# Patient Record
Sex: Male | Born: 1955 | Race: Black or African American | Hispanic: No | Marital: Single | Smoking: Never smoker
Health system: Southern US, Community
[De-identification: ages and names within clinical notes are randomized; demographics above are authoritative.]

## PROBLEM LIST (undated history)

## (undated) DIAGNOSIS — I10 Essential (primary) hypertension: Secondary | ICD-10-CM

## (undated) HISTORY — DX: Essential (primary) hypertension: I10

---

## 1997-10-15 ENCOUNTER — Encounter: Admission: RE | Admit: 1997-10-15 | Discharge: 1997-10-15 | Payer: Self-pay | Admitting: *Deleted

## 2016-07-25 ENCOUNTER — Ambulatory Visit (INDEPENDENT_AMBULATORY_CARE_PROVIDER_SITE_OTHER): Payer: Self-pay

## 2016-07-25 ENCOUNTER — Ambulatory Visit (INDEPENDENT_AMBULATORY_CARE_PROVIDER_SITE_OTHER): Payer: Self-pay | Admitting: Emergency Medicine

## 2016-07-25 ENCOUNTER — Encounter: Payer: Self-pay | Admitting: Emergency Medicine

## 2016-07-25 VITALS — BP 161/110 | HR 104 | Temp 98.0°F | Resp 18 | Ht 69.37 in | Wt 235.8 lb

## 2016-07-25 DIAGNOSIS — R0609 Other forms of dyspnea: Secondary | ICD-10-CM

## 2016-07-25 DIAGNOSIS — R0602 Shortness of breath: Secondary | ICD-10-CM

## 2016-07-25 DIAGNOSIS — I10 Essential (primary) hypertension: Secondary | ICD-10-CM | POA: Insufficient documentation

## 2016-07-25 DIAGNOSIS — I517 Cardiomegaly: Secondary | ICD-10-CM

## 2016-07-25 LAB — COMPREHENSIVE METABOLIC PANEL
ALBUMIN: 4.6 g/dL (ref 3.6–4.8)
ALT: 26 IU/L (ref 0–44)
AST: 23 IU/L (ref 0–40)
Albumin/Globulin Ratio: 1.2 (ref 1.2–2.2)
Alkaline Phosphatase: 87 IU/L (ref 39–117)
BUN / CREAT RATIO: 8 — AB (ref 10–24)
BUN: 10 mg/dL (ref 8–27)
Bilirubin Total: 0.9 mg/dL (ref 0.0–1.2)
CALCIUM: 10.2 mg/dL (ref 8.6–10.2)
CO2: 22 mmol/L (ref 18–29)
CREATININE: 1.2 mg/dL (ref 0.76–1.27)
Chloride: 100 mmol/L (ref 96–106)
GFR, EST AFRICAN AMERICAN: 76 mL/min/{1.73_m2} (ref >59)
GFR, EST NON AFRICAN AMERICAN: 65 mL/min/{1.73_m2} (ref >59)
GLUCOSE: 117 mg/dL — AB (ref 65–99)
Globulin, Total: 3.7 g/dL (ref 1.5–4.5)
Potassium: 4.3 mmol/L (ref 3.5–5.2)
Sodium: 142 mmol/L (ref 134–144)
TOTAL PROTEIN: 8.3 g/dL (ref 6.0–8.5)

## 2016-07-25 LAB — CBC WITH DIFFERENTIAL/PLATELET
BASOS ABS: 0 10*3/uL (ref 0.0–0.2)
Basos: 0 %
EOS (ABSOLUTE): 0.1 10*3/uL (ref 0.0–0.4)
Eos: 1 %
HEMOGLOBIN: 17 g/dL (ref 13.0–17.7)
Hematocrit: 48.8 % (ref 37.5–51.0)
IMMATURE GRANS (ABS): 0 10*3/uL (ref 0.0–0.1)
IMMATURE GRANULOCYTES: 0 %
LYMPHS: 44 %
Lymphocytes Absolute: 2.5 10*3/uL (ref 0.7–3.1)
MCH: 29.9 pg (ref 26.6–33.0)
MCHC: 34.8 g/dL (ref 31.5–35.7)
MCV: 86 fL (ref 79–97)
MONOCYTES: 7 %
Monocytes Absolute: 0.4 10*3/uL (ref 0.1–0.9)
NEUTROS ABS: 2.8 10*3/uL (ref 1.4–7.0)
NEUTROS PCT: 48 %
PLATELETS: 281 10*3/uL (ref 150–379)
RBC: 5.68 x10E6/uL (ref 4.14–5.80)
RDW: 14.6 % (ref 12.3–15.4)
WBC: 5.8 10*3/uL (ref 3.4–10.8)

## 2016-07-25 LAB — LIPID PANEL
Chol/HDL Ratio: 5.1 ratio — ABNORMAL HIGH (ref 0.0–5.0)
Cholesterol, Total: 194 mg/dL (ref 100–199)
HDL: 38 mg/dL — AB (ref >39)
LDL CALC: 117 mg/dL — AB (ref 0–99)
Triglycerides: 194 mg/dL — ABNORMAL HIGH (ref 0–149)
VLDL CHOLESTEROL CAL: 39 mg/dL (ref 5–40)

## 2016-07-25 LAB — HEMOGLOBIN A1C
ESTIMATED AVERAGE GLUCOSE: 134 mg/dL
HEMOGLOBIN A1C: 6.3 % — AB (ref 4.8–5.6)

## 2016-07-25 LAB — PLEASE NOTE

## 2016-07-25 MED ORDER — AMLODIPINE BESYLATE 10 MG PO TABS: 10.0000 mg | ORAL_TABLET | Freq: Every day | ORAL | 3 refills | Status: DC

## 2016-07-25 NOTE — Progress Notes (Signed)
Justin Knox 61 y.o.   Chief Complaint  Patient presents with  . Shortness of Breath    X 2 weeks- pt states that this has been an ongoing X 1 year    HISTORY OF PRESENT ILLNESS: This is a 61 y.o. male complaining of SOB x several months with clear phlegm; +DOE x 8-10 days; also c/o midsternal pressure on and off x 2 weeks but not always related to exertion; has h/o HTN for years but never treated.  HPI   Prior to Admission medications   Not on File    No Known Allergies  There are no active problems to display for this patient.   Past Medical History:  Diagnosis Date  . Hypertension     History reviewed. No pertinent surgical history.  Social History   Social History  . Marital status: Single    Spouse name: N/A  . Number of children: N/A  . Years of education: N/A   Occupational History  . Not on file.   Social History Main Topics  . Smoking status: Never Smoker  . Smokeless tobacco: Never Used  . Alcohol use No  . Drug use: No  . Sexual activity: Not on file   Other Topics Concern  . Not on file   Social History Narrative  . No narrative on file    Family History  Problem Relation Age of Onset  . Diabetes Mother   . Heart disease Mother   . Hypertension Mother   . Diabetes Father   . Hyperlipidemia Father   . Diabetes Sister   . Diabetes Brother   . Diabetes Brother   . Hyperlipidemia Brother   . Hypertension Brother      Review of Systems  Constitutional: Negative for chills, fever and weight loss.  HENT: Negative.   Eyes: Negative for blurred vision and double vision.  Respiratory: Positive for sputum production (clear) and shortness of breath. Negative for cough and wheezing.   Cardiovascular: Positive for chest pain. Negative for palpitations, orthopnea, claudication, leg swelling and PND.  Gastrointestinal: Negative.  Negative for abdominal pain, blood in stool, diarrhea, melena, nausea and vomiting.  Genitourinary:  Negative.  Negative for dysuria and hematuria.  Musculoskeletal: Negative.   Skin: Negative.  Negative for rash.  Neurological: Positive for weakness (general). Negative for dizziness, sensory change, focal weakness and headaches.  Endo/Heme/Allergies: Negative.   All other systems reviewed and are negative. CXR: +cardiomegaly with mild vascular congestion  EKG: NSR, no acute ischemic changes; +LVH and LAE Physical Exam  Constitutional: He is oriented to person, place, and time. He appears well-developed and well-nourished.  HENT:  Head: Normocephalic and atraumatic.  Nose: Nose normal.  Mouth/Throat: Oropharynx is clear and moist. No oropharyngeal exudate.  Eyes: Conjunctivae and EOM are normal. Pupils are equal, round, and reactive to light.  Neck: Normal range of motion. Neck supple. No JVD present. No thyromegaly present.  Cardiovascular: Normal rate, regular rhythm, normal heart sounds and intact distal pulses.   Pulmonary/Chest: Effort normal and breath sounds normal.  Abdominal: Soft. Bowel sounds are normal. He exhibits no distension. There is no tenderness.  Musculoskeletal: Normal range of motion.  Lymphadenopathy:    He has no cervical adenopathy.  Neurological: He is alert and oriented to person, place, and time. No sensory deficit. He exhibits normal muscle tone. Coordination normal.  Skin: Skin is warm and dry. Capillary refill takes less than 2 seconds.  Psychiatric: He has a normal mood and affect. His  behavior is normal.  Vitals reviewed.    ASSESSMENT & PLAN: Justin Knox was seen today for shortness of breath.  Diagnoses and all orders for this visit:  SOB (shortness of breath) -     EKG 12-Lead -     Brain natriuretic peptide -     DG Chest 2 View; Future -     Ambulatory referral to Cardiology  Essential hypertension -     CBC with Differential/Platelet -     Comprehensive metabolic panel -     Lipid panel -     Hemoglobin A1c -     Ambulatory referral to  Cardiology  LVH (left ventricular hypertrophy) -     Ambulatory referral to Cardiology  DOE (dyspnea on exertion) -     DG Chest 2 View; Future -     Ambulatory referral to Cardiology  Other orders -     amLODipine (NORVASC) 10 MG tablet; Take 1 tablet (10 mg total) by mouth daily.    Patient Instructions       IF you received an x-ray today, you will receive an invoice from [facility]. Please contact [facility] at [phone] with questions or concerns regarding your invoice.   IF you received labwork today, you will receive an invoice from [facility]. Please contact [facility] at [phone] with questions or concerns regarding your invoice.   Our billing staff will not be able to assist you with questions regarding bills from these companies.  You will be contacted with the lab results as soon as they are available. The fastest way to get your results is to activate your My Chart account. Instructions are located on the last page of this paperwork. If you have not heard from [place] regarding the results in 2 weeks, please contact this office.       Hypertension Hypertension is another name for high blood pressure. High blood pressure forces your heart to work harder to pump blood. This can cause problems over time. There are two numbers in a blood pressure reading. There is a top number (systolic) over a bottom number (diastolic). It is best to have a blood pressure below 120/80. Healthy choices can help lower your blood pressure. You may need medicine to help lower your blood pressure if:  Your blood pressure cannot be lowered with healthy choices.  Your blood pressure is higher than 130/80. Follow these instructions at home: Eating and drinking   If directed, follow the DASH eating plan. This diet includes:  Filling half of your plate at each meal with fruits and vegetables.  Filling one quarter of your plate at each meal with whole grains. Whole  grains include whole wheat pasta, brown rice, and whole grain bread.  Eating or drinking low-fat dairy products, such as skim milk or low-fat yogurt.  Filling one quarter of your plate at each meal with low-fat (lean) proteins. Low-fat proteins include fish, skinless chicken, eggs, beans, and tofu.  Avoiding fatty meat, cured and processed meat, or chicken with skin.  Avoiding premade or processed food.  Eat less than 1,500 mg of salt (sodium) a day.  Limit alcohol use to no more than 1 drink a day for nonpregnant women and 2 drinks a day for men. One drink equals 12 oz of beer, 5 oz of wine, or 1 oz of hard liquor. Lifestyle   Work with your doctor to stay at a healthy weight or to lose weight. Ask your doctor what the best weight is for  you.  Get at least 30 minutes of exercise that causes your heart to beat faster (aerobic exercise) most days of the week. This may include walking, swimming, or biking.  Get at least 30 minutes of exercise that strengthens your muscles (resistance exercise) at least 3 days a week. This may include lifting weights or pilates.  Do not use any products that contain nicotine or tobacco. This includes cigarettes and e-cigarettes. If you need help quitting, ask your doctor.  Check your blood pressure at home as told by your doctor.  Keep all follow-up visits as told by your doctor. This is important. Medicines   Take over-the-counter and prescription medicines only as told by your doctor. Follow directions carefully.  Do not skip doses of blood pressure medicine. The medicine does not work as well if you skip doses. Skipping doses also puts you at risk for problems.  Ask your doctor about side effects or reactions to medicines that you should watch for. Contact a doctor if:  You think you are having a reaction to the medicine you are taking.  You have headaches that keep coming back (recurring).  You feel dizzy.  You have swelling in your  ankles.  You have trouble with your vision. Get help right away if:  You get a very bad headache.  You start to feel confused.  You feel weak or numb.  You feel faint.  You get very bad pain in your:  Chest.  Belly (abdomen).  You throw up (vomit) more than once.  You have trouble breathing. Summary  Hypertension is another name for high blood pressure.  Making healthy choices can help lower blood pressure. If your blood pressure cannot be controlled with healthy choices, you may need to take medicine. This information is not intended to replace advice given to you by your health care provider. Make sure you discuss any questions you have with your health care provider. Document Released: 08/22/2007 Document Revised: 02/01/2016 Document Reviewed: 02/01/2016 Elsevier Interactive Patient Education  2017 Elsevier Inc.      Edwina Barth, MD [facility]

## 2016-07-25 NOTE — Patient Instructions (Addendum)
   IF you received an x-ray today, you will receive an invoice from [facility]. Please contact [facility] at [phone] with questions or concerns regarding your invoice.   IF you received labwork today, you will receive an invoice from [facility]. Please contact [facility] at [phone] with questions or concerns regarding your invoice.   Our billing staff will not be able to assist you with questions regarding bills from these companies.  You will be contacted with the lab results as soon as they are available. The fastest way to get your results is to activate your My Chart account. Instructions are located on the last page of this paperwork. If you have not heard from us regarding the results in 2 weeks, please contact this office.      Hypertension Hypertension is another name for high blood pressure. High blood pressure forces your heart to work harder to pump blood. This can cause problems over time. There are two numbers in a blood pressure reading. There is a top number (systolic) over a bottom number (diastolic). It is best to have a blood pressure below 120/80. Healthy choices can help lower your blood pressure. You may need medicine to help lower your blood pressure if:  Your blood pressure cannot be lowered with healthy choices.  Your blood pressure is higher than 130/80. Follow these instructions at home: Eating and drinking   If directed, follow the DASH eating plan. This diet includes:  Filling half of your plate at each meal with fruits and vegetables.  Filling one quarter of your plate at each meal with whole grains. Whole grains include whole wheat pasta, brown rice, and whole grain bread.  Eating or drinking low-fat dairy products, such as skim milk or low-fat yogurt.  Filling one quarter of your plate at each meal with low-fat (lean) proteins. Low-fat proteins include fish, skinless chicken, eggs, beans, and tofu.  Avoiding fatty meat,  cured and processed meat, or chicken with skin.  Avoiding premade or processed food.  Eat less than 1,500 mg of salt (sodium) a day.  Limit alcohol use to no more than 1 drink a day for nonpregnant women and 2 drinks a day for men. One drink equals 12 oz of beer, 5 oz of wine, or 1 oz of hard liquor. Lifestyle   Work with your doctor to stay at a healthy weight or to lose weight. Ask your doctor what the best weight is for you.  Get at least 30 minutes of exercise that causes your heart to beat faster (aerobic exercise) most days of the week. This may include walking, swimming, or biking.  Get at least 30 minutes of exercise that strengthens your muscles (resistance exercise) at least 3 days a week. This may include lifting weights or pilates.  Do not use any products that contain nicotine or tobacco. This includes cigarettes and e-cigarettes. If you need help quitting, ask your doctor.  Check your blood pressure at home as told by your doctor.  Keep all follow-up visits as told by your doctor. This is important. Medicines   Take over-the-counter and prescription medicines only as told by your doctor. Follow directions carefully.  Do not skip doses of blood pressure medicine. The medicine does not work as well if you skip doses. Skipping doses also puts you at risk for problems.  Ask your doctor about side effects or reactions to medicines that you should watch for. Contact a doctor if:  You think you are having a   reaction to the medicine you are taking.  You have headaches that keep coming back (recurring).  You feel dizzy.  You have swelling in your ankles.  You have trouble with your vision. Get help right away if:  You get a very bad headache.  You start to feel confused.  You feel weak or numb.  You feel faint.  You get very bad pain in your:  Chest.  Belly (abdomen).  You throw up (vomit) more than once.  You have trouble  breathing. Summary  Hypertension is another name for high blood pressure.  Making healthy choices can help lower blood pressure. If your blood pressure cannot be controlled with healthy choices, you may need to take medicine. This information is not intended to replace advice given to you by your health care provider. Make sure you discuss any questions you have with your health care provider. Document Released: 08/22/2007 Document Revised: 02/01/2016 Document Reviewed: 02/01/2016 Elsevier Interactive Patient Education  2017 Elsevier Inc.  

## 2016-07-26 LAB — BRAIN NATRIURETIC PEPTIDE: BNP: 137.6 pg/mL — AB (ref 0.0–100.0)

## 2016-08-02 ENCOUNTER — Telehealth: Payer: Self-pay | Admitting: General Practice

## 2016-08-02 NOTE — Telephone Encounter (Signed)
Pt is looking for lab results   Best number [phone]

## 2016-08-02 NOTE — Telephone Encounter (Signed)
Pre-diabetic with high cholesterol. Needs low fat diet and should be started on statin. Needs cardiology evaluation.

## 2016-08-02 NOTE — Telephone Encounter (Signed)
See abnormals and advise 

## 2016-08-02 NOTE — Telephone Encounter (Signed)
Pt advised of dr Darlin Dropsagardias note and given office # to [facility]

## 2016-08-08 NOTE — Telephone Encounter (Signed)
Keep the scheduled appointment. 10/02/16 is OK. He can always see us for f/u before if he so desires or needs to.

## 2016-08-08 NOTE — Telephone Encounter (Signed)
Pt left vm for referrals concerning cardiology referral. I tried calling pt back on home phone and it was not connected. I also tried the cell pt left [phone] and it went straight to vm. Left pt message to call back. I did [facility] And [facility] had him scheduled for 10/02/16. Does pt need to be seen sooner due to urgent referral? I was unsure if pt scheduled this himself or if they scheduled before talking to pt. Please advise.

## 2016-08-09 NOTE — Telephone Encounter (Signed)
Pt advised.

## 2016-08-22 ENCOUNTER — Institutional Professional Consult (permissible substitution): Payer: Self-pay | Admitting: Pulmonary Disease

## 2016-10-02 ENCOUNTER — Encounter (INDEPENDENT_AMBULATORY_CARE_PROVIDER_SITE_OTHER): Payer: Self-pay

## 2016-10-02 ENCOUNTER — Encounter: Payer: Self-pay | Admitting: Cardiology

## 2016-10-02 ENCOUNTER — Ambulatory Visit (INDEPENDENT_AMBULATORY_CARE_PROVIDER_SITE_OTHER): Payer: Self-pay | Admitting: Cardiology

## 2016-10-02 VITALS — BP 122/78 | HR 70 | Ht 69.3 in | Wt 218.0 lb

## 2016-10-02 DIAGNOSIS — I10 Essential (primary) hypertension: Secondary | ICD-10-CM

## 2016-10-02 DIAGNOSIS — R0602 Shortness of breath: Secondary | ICD-10-CM

## 2016-10-02 DIAGNOSIS — R079 Chest pain, unspecified: Secondary | ICD-10-CM

## 2016-10-02 DIAGNOSIS — R7303 Prediabetes: Secondary | ICD-10-CM

## 2016-10-02 NOTE — Patient Instructions (Signed)
Medication Instructions:  Your physician recommends that you continue on your current medications as directed. Please refer to the Current Medication list given to you today.   Labwork: None  Testing/Procedures: Your physician has requested that you have an echocardiogram. Echocardiography is a painless test that uses sound waves to create images of your heart. It provides your doctor with information about the size and shape of your heart and how well your heart's chambers and valves are working. This procedure takes approximately one hour. There are no restrictions for this procedure.   Dr. Mayford Knifeurner recommends you have a NUCLEAR STRESS TEST.  Dr. Mayford Knifeurner recommends you have a CALCIUM SCORE.  Follow-Up: You have been referred to a NUTRITIONIST.  Your physician wants you to follow-up in: 1 year with Dr. Mayford Knifeurner. You will receive a reminder letter in the mail two months in advance. If you don't receive a letter, please call our office to schedule the follow-up appointment.   Any Other Special Instructions Will Be Listed Below (If Applicable).     If you need a refill on your cardiac medications before your next appointment, please call your pharmacy.

## 2016-10-02 NOTE — Progress Notes (Signed)
Cardiology Office Note    Date:  10/02/2016   ID:  RISHIKESH KHACHATRYAN, DOB [birth year], MRN [id number]  PCP:  Georgina Quint, MD  Cardiologist:  Armanda Magic, MD   Chief Complaint  Patient presents with  . Chest Pain  . Shortness of Breath    History of Present Illness:  Justin Knox is a 61 y.o. male who is being seen today for the evaluation of SOB and CP at the request of Georgina Quint, MD.    This is a 61yo male with a history of HTN who presented to his PCPs office in May complaining of a 2 week history of DOE, several month h/o SOB and midsternal CP off and on for 2 weeks that is exertional and nonexertional.  He apparently has had HTN for many year but was never treated. He was started on amlodipine and about 8 days later his symptoms of SOB resolved completely.  He has not had any further CP as well.      Past Medical History:  Diagnosis Date  . Hypertension     History reviewed. No pertinent surgical history.  Current Medications: Current Meds  Medication Sig  . amLODipine (NORVASC) 10 MG tablet Take 1 tablet (10 mg total) by mouth daily.    Allergies:   Patient has no known allergies.   Social History   Social History  . Marital status: Single    Spouse name: N/A  . Number of children: N/A  . Years of education: N/A   Social History Main Topics  . Smoking status: Never Smoker  . Smokeless tobacco: Never Used  . Alcohol use No  . Drug use: No  . Sexual activity: Not Asked   Other Topics Concern  . None   Social History Narrative  . None     Family History:  The patient's family history includes Diabetes in his brother, brother, father, mother, and sister; Heart disease in his mother; Hyperlipidemia in his brother and father; Hypertension in his brother and mother.   ROS:   Please see the history of present illness.    ROS All other systems reviewed and are negative.  No flowsheet data found.     PHYSICAL EXAM:     VS:  BP 122/78   Pulse 70   Ht 5' 9.3" (1.76 m)   Wt 218 lb (98.9 kg)   SpO2 97%   BMI 31.91 kg/m    GEN: Well nourished, well developed, in no acute distress  HEENT: normal  Neck: no JVD, carotid bruits, or masses Cardiac: RRR; no murmurs, rubs, or gallops,no edema.  Intact distal pulses bilaterally.  Respiratory:  clear to auscultation bilaterally, normal work of breathing GI: soft, nontender, nondistended, + BS MS: no deformity or atrophy  Skin: warm and dry, no rash Neuro:  Alert and Oriented x 3, Strength and sensation are intact Psych: euthymic mood, full affect  Wt Readings from Last 3 Encounters:  10/02/16 218 lb (98.9 kg)  07/25/16 235 lb 12.8 oz (107 kg)      Studies/Labs Reviewed:   EKG:  EKG is ordered today.  The ekg ordered today demonstrates NSR with PVC with ST/T wave abnormality in the lateral precordial leads  Recent Labs: 07/25/2016: ALT 26; BNP 137.6; BUN 10; Creatinine, Ser 1.20; Hemoglobin 17.0; Platelets 281; Potassium 4.3; Sodium 142   Lipid Panel    Component Value Date/Time   CHOL 194 07/25/2016 1038   TRIG 194 (H) 07/25/2016 1038  HDL 38 (L) 07/25/2016 1038   CHOLHDL 5.1 (H) 07/25/2016 1038   LDLCALC 117 (H) 07/25/2016 1038    Additional studies/ records that were reviewed today include:  Office notes from PCP    ASSESSMENT:    1. SOB (shortness of breath)   2. Chest pain, unspecified type   3. Essential hypertension      PLAN:  In order of problems listed above:  1. SOB - ? Etiology.  Likely related to poorly controlled HTN as his symptoms completely resolved after starting BP med.   He has no history of smoking. He has LVH on his EKG.   I will get an echo to assess for end organ damage from HTN (LVH, DCM).  2.  Chest pain that is exertional and nonexertional.  This has completely resolved on BP meds and was likely due to supply demand mismatch in the setting of poorly controlled HTN.  He does have CRFs including long standing  HTN and family history of CAD (father died at 3438 from CAD, mom died at 2157 from CHF).  I will get a stress myoview to rule out ischemia. I will also get a chest CT for coronary calcium score to assess future risk. If he is at moderate risk or high risk then would need to be more aggressive with lipid control.    3.  HTN - his BP is adequately controlled on exam today.  He will continue on amlodipine.    Medication Adjustments/Labs and Tests Ordered: Current medicines are reviewed at length with the patient today.  Concerns regarding medicines are outlined above.  Medication changes, Labs and Tests ordered today are listed in the Patient Instructions below.  There are no Patient Instructions on file for this visit.   Signed, Armanda Magicraci Turner, MD  10/02/2016 10:56 AM    [facility] [address], [place], [place]  [address] Phone: [phone]; Fax: [phone]

## 2016-10-04 ENCOUNTER — Telehealth (HOSPITAL_COMMUNITY): Payer: Self-pay | Admitting: *Deleted

## 2016-10-04 NOTE — Telephone Encounter (Signed)
Patient given detailed instructions per Myocardial Perfusion Study Information Sheet for the test on  10/08/16. Patient notified to arrive 15 minutes early and that it is imperative to arrive on time for appointment to keep from having the test rescheduled.  If you need to cancel or reschedule your appointment, please call the office within 24 hours of your appointment. . Patient verbalized understanding. Jaxxon Naeem Jacqueline    

## 2016-10-08 ENCOUNTER — Ambulatory Visit (HOSPITAL_COMMUNITY): Payer: Self-pay | Attending: Cardiology

## 2016-10-08 DIAGNOSIS — R079 Chest pain, unspecified: Secondary | ICD-10-CM | POA: Insufficient documentation

## 2016-10-08 LAB — MYOCARDIAL PERFUSION IMAGING
CHL CUP MPHR: 160 {beats}/min
CHL CUP NUCLEAR SDS: 5
CHL CUP RESTING HR STRESS: 61 {beats}/min
CSEPED: 8 min
Estimated workload: 10.1 METS
Exercise duration (sec): 0 s
LHR: 0.38
LVDIAVOL: 257 mL (ref 62–150)
LVSYSVOL: 181 mL
NUC STRESS TID: 1.03
Peak HR: 151 {beats}/min
Percent HR: 94 %
SRS: 2
SSS: 5

## 2016-10-08 MED ORDER — TECHNETIUM TC 99M TETROFOSMIN IV KIT
10.7000 | PACK | Freq: Once | INTRAVENOUS | Status: AC | PRN
  Administered 2016-10-08: 10.7 via INTRAVENOUS
  Filled 2016-10-08: qty 11

## 2016-10-08 MED ORDER — TECHNETIUM TC 99M TETROFOSMIN IV KIT
32.0000 | PACK | Freq: Once | INTRAVENOUS | Status: AC | PRN
Start: 2016-10-08 — End: 2016-10-08
  Administered 2016-10-08: 32 via INTRAVENOUS
  Filled 2016-10-08: qty 32

## 2016-10-10 ENCOUNTER — Ambulatory Visit (INDEPENDENT_AMBULATORY_CARE_PROVIDER_SITE_OTHER)
Admission: RE | Admit: 2016-10-10 | Discharge: 2016-10-10 | Disposition: A | Discharge status: Home / Self Care | Payer: Self-pay | Source: Ambulatory Visit | Attending: Cardiology | Admitting: Cardiology

## 2016-10-10 ENCOUNTER — Ambulatory Visit (HOSPITAL_COMMUNITY): Payer: Self-pay | Attending: Internal Medicine

## 2016-10-10 ENCOUNTER — Other Ambulatory Visit: Payer: Self-pay

## 2016-10-10 DIAGNOSIS — I358 Other nonrheumatic aortic valve disorders: Secondary | ICD-10-CM | POA: Insufficient documentation

## 2016-10-10 DIAGNOSIS — I77810 Thoracic aortic ectasia: Secondary | ICD-10-CM | POA: Insufficient documentation

## 2016-10-10 DIAGNOSIS — I361 Nonrheumatic tricuspid (valve) insufficiency: Secondary | ICD-10-CM | POA: Insufficient documentation

## 2016-10-10 DIAGNOSIS — R079 Chest pain, unspecified: Secondary | ICD-10-CM

## 2016-10-10 DIAGNOSIS — I119 Hypertensive heart disease without heart failure: Secondary | ICD-10-CM | POA: Insufficient documentation

## 2016-10-10 DIAGNOSIS — R0602 Shortness of breath: Secondary | ICD-10-CM | POA: Insufficient documentation

## 2016-10-11 ENCOUNTER — Telehealth: Payer: Self-pay | Admitting: Cardiology

## 2016-10-11 ENCOUNTER — Ambulatory Visit: Payer: Self-pay | Admitting: Registered"

## 2016-10-11 DIAGNOSIS — E785 Hyperlipidemia, unspecified: Secondary | ICD-10-CM

## 2016-10-11 DIAGNOSIS — I7121 Aneurysm of the ascending aorta, without rupture: Secondary | ICD-10-CM

## 2016-10-11 DIAGNOSIS — I712 Thoracic aortic aneurysm, without rupture: Secondary | ICD-10-CM

## 2016-10-11 MED ORDER — ATORVASTATIN CALCIUM 20 MG PO TABS: 20.0000 mg | ORAL_TABLET | Freq: Every day | ORAL | 3 refills | Status: DC

## 2016-10-11 NOTE — Telephone Encounter (Signed)
-----   Message from Quintella Reichertraci R Turner, MD sent at 10/10/2016  8:34 PM EDT ----- Chest CT showed 4.4cm ascending aortic aneurysm - please repeat Chest CT angio in 1 year.

## 2016-10-11 NOTE — Telephone Encounter (Signed)
New message     Call patient after 330p  For test results

## 2016-10-11 NOTE — Telephone Encounter (Signed)
Informed patient of results and verbal understanding expressed.  Reviewed stress test, echo and calcium score results with patient. Repeat CT ordered for scheduling in 1 year. Patient will start lipitor 20 mg daily and have repeat fasting labs 9/26. He understands the gravity of results, but absolutely refuses to schedule catheterization at this time.  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  Reiterated to patient the important of having the test scheduled ASAP. He states he will work on the schedule and call tomorrow to arrange. He understands to call 911 immediately if CP or SOB, N/V occur.

## 2016-10-11 NOTE — Telephone Encounter (Signed)
-----   Message from Quintella Reichertraci R Turner, MD sent at 10/10/2016  8:36 PM EDT ----- Given his thoracic aortic aneurysm he needs an LDL < 70  - please start Lipitor 20mg  daily as his LDL in May was 117.  Please repeat FLP and ALT in 6 weeks

## 2016-10-11 NOTE — Telephone Encounter (Signed)
-----   Message from Quintella Reichertraci R Turner, MD sent at 10/08/2016  8:01 PM EDT ----- Nuclear stress test with EF 30% with ? Mild ischemia in the apex significant ischemia - await results of echo and CT calcium score before further recommendations

## 2016-10-11 NOTE — Telephone Encounter (Signed)
-----   Message from Quintella Reichertraci R Turner, MD sent at 10/10/2016  8:32 PM EDT ----- Echo showed moderately reduced LVF with EF 35-40%, moderately dilated LV, mildly dilated Aorta, mild MR/TR.  In light of nuclear stress test that is high risk and also shows LV dysfunction, chest pain and SOB - please set up for cath ASAP

## 2016-10-16 NOTE — Telephone Encounter (Signed)
I spoke with pt about scheduling catheterization. He is very concerned about making child care arrangements. He needs to know exact timing of procedure, how long it would take and when he could go home.  I explained to pt I could give him information regarding the timing of the start of procedure but I could not give him exact information about how long he would be in the hospital following procedure. He does not want to schedule now and will call us back to schedule.

## 2016-10-17 NOTE — Telephone Encounter (Signed)
Please see my note attached to the CHest CT from 7/25 (note done on 7/30) - his Coronary Ca score came back at 0 so unlikely to have significant CAD.  No need for cath.  I asked that a coronary CTA with morphology be done to rule  Out aberrant coronary artery

## 2016-10-17 NOTE — Telephone Encounter (Signed)
Called and made patient aware that, per Dr. Mayford Knifeurner, since his Coronary Ca score came at at 0 he is unlikely to have significant CAD, so she did not feel like a cath was needed at this time. Also made him aware that Dr. Mayford Knifeurner would like for him to have a coronary CTA with morphology to rule out aberrant coronary artery. Patient states that he does not have insurance and that he cannot afford to have another scan done at this time.

## 2016-12-12 ENCOUNTER — Other Ambulatory Visit: Payer: Self-pay

## 2017-07-15 ENCOUNTER — Other Ambulatory Visit: Payer: Self-pay | Admitting: Emergency Medicine

## 2017-09-24 ENCOUNTER — Telehealth: Payer: Self-pay | Admitting: Cardiology

## 2017-09-24 NOTE — Telephone Encounter (Signed)
New Message    Per Misty StanleyStacey CT patient refused CT due to not being able to afford test. This is for documentation and information purposes.

## 2017-09-24 NOTE — Telephone Encounter (Signed)
Please see if insurance would pay for coronary CTA with FFR

## 2017-10-01 ENCOUNTER — Other Ambulatory Visit: Payer: Self-pay

## 2017-10-21 ENCOUNTER — Encounter: Payer: Self-pay | Admitting: Emergency Medicine

## 2017-10-21 ENCOUNTER — Ambulatory Visit: Payer: Self-pay | Admitting: Emergency Medicine

## 2017-10-21 ENCOUNTER — Other Ambulatory Visit: Payer: Self-pay

## 2017-10-21 VITALS — BP 106/72 | HR 80 | Temp 98.5°F | Resp 16 | Ht 69.0 in | Wt 218.6 lb

## 2017-10-21 DIAGNOSIS — I119 Hypertensive heart disease without heart failure: Secondary | ICD-10-CM

## 2017-10-21 DIAGNOSIS — I10 Essential (primary) hypertension: Secondary | ICD-10-CM

## 2017-10-21 DIAGNOSIS — E785 Hyperlipidemia, unspecified: Secondary | ICD-10-CM | POA: Insufficient documentation

## 2017-10-21 DIAGNOSIS — R7303 Prediabetes: Secondary | ICD-10-CM | POA: Insufficient documentation

## 2017-10-21 DIAGNOSIS — I517 Cardiomegaly: Secondary | ICD-10-CM

## 2017-10-21 LAB — CBC WITH DIFFERENTIAL/PLATELET
BASOS: 0 %
Basophils Absolute: 0 10*3/uL (ref 0.0–0.2)
EOS (ABSOLUTE): 0 10*3/uL (ref 0.0–0.4)
Eos: 1 %
Hematocrit: 44.9 % (ref 37.5–51.0)
Hemoglobin: 15.8 g/dL (ref 13.0–17.7)
IMMATURE GRANS (ABS): 0 10*3/uL (ref 0.0–0.1)
Immature Granulocytes: 0 %
LYMPHS: 47 %
Lymphocytes Absolute: 2.1 10*3/uL (ref 0.7–3.1)
MCH: 30.2 pg (ref 26.6–33.0)
MCHC: 35.2 g/dL (ref 31.5–35.7)
MCV: 86 fL (ref 79–97)
MONOCYTES: 8 %
Monocytes Absolute: 0.4 10*3/uL (ref 0.1–0.9)
NEUTROS ABS: 1.9 10*3/uL (ref 1.4–7.0)
Neutrophils: 44 %
PLATELETS: 227 10*3/uL (ref 150–450)
RBC: 5.24 x10E6/uL (ref 4.14–5.80)
RDW: 14.1 % (ref 12.3–15.4)
WBC: 4.4 10*3/uL (ref 3.4–10.8)

## 2017-10-21 LAB — COMPREHENSIVE METABOLIC PANEL
A/G RATIO: 1.5 (ref 1.2–2.2)
ALK PHOS: 94 IU/L (ref 39–117)
ALT: 28 IU/L (ref 0–44)
AST: 23 IU/L (ref 0–40)
Albumin: 4.7 g/dL (ref 3.6–4.8)
BUN / CREAT RATIO: 8 — AB (ref 10–24)
BUN: 8 mg/dL (ref 8–27)
Bilirubin Total: 0.5 mg/dL (ref 0.0–1.2)
CHLORIDE: 104 mmol/L (ref 96–106)
CO2: 26 mmol/L (ref 20–29)
Calcium: 9.9 mg/dL (ref 8.6–10.2)
Creatinine, Ser: 1.02 mg/dL (ref 0.76–1.27)
GFR calc non Af Amer: 79 mL/min/{1.73_m2} (ref >59)
GFR, EST AFRICAN AMERICAN: 91 mL/min/{1.73_m2} (ref >59)
GLUCOSE: 92 mg/dL (ref 65–99)
Globulin, Total: 3.2 g/dL (ref 1.5–4.5)
POTASSIUM: 4.3 mmol/L (ref 3.5–5.2)
Sodium: 145 mmol/L — ABNORMAL HIGH (ref 134–144)
TOTAL PROTEIN: 7.9 g/dL (ref 6.0–8.5)

## 2017-10-21 LAB — LIPID PANEL
CHOLESTEROL TOTAL: 122 mg/dL (ref 100–199)
Chol/HDL Ratio: 2.8 ratio (ref 0.0–5.0)
HDL: 43 mg/dL (ref >39)
LDL Calculated: 53 mg/dL (ref 0–99)
Triglycerides: 130 mg/dL (ref 0–149)
VLDL Cholesterol Cal: 26 mg/dL (ref 5–40)

## 2017-10-21 LAB — HEMOGLOBIN A1C
ESTIMATED AVERAGE GLUCOSE: 126 mg/dL
HEMOGLOBIN A1C: 6 % — AB (ref 4.8–5.6)

## 2017-10-21 MED ORDER — AMLODIPINE BESYLATE 10 MG PO TABS
10.0000 mg | ORAL_TABLET | Freq: Every day | ORAL | 3 refills | Status: DC
Start: 2017-10-21 — End: 2018-10-21

## 2017-10-21 MED ORDER — ATORVASTATIN CALCIUM 20 MG PO TABS: 20.0000 mg | ORAL_TABLET | Freq: Every day | ORAL | 3 refills | Status: DC

## 2017-10-21 NOTE — Assessment & Plan Note (Signed)
Lipid profile done today.  Continue Lipitor with exercise and diet as recommended.

## 2017-10-21 NOTE — Progress Notes (Signed)
Wt Readings from Last 3 Encounters:  10/21/17 218 lb 9.6 oz (99.2 kg)  10/08/16 218 lb (98.9 kg)  10/02/16 218 lb (98.9 kg)   BP Readings from Last 3 Encounters:  10/21/17 106/72  10/02/16 122/78  07/25/16 (!) 161/110    Justin Knox 62 y.o.   Chief Complaint  Patient presents with  . Medication Refill    amlodipine and atorvastatin    HISTORY OF PRESENT ILLNESS: This is a 62 y.o. male with history of hypertension and dyslipidemia.  Here for follow-up.  Needs medication refills.  Has no medical concerns or complaints today.  Compliant with medications and diet.  Work schedule prevents him from sleeping adequately, not enough hours. Seen by me last on 07/25/2016.  At that time he was complaining of intermittent difficulty breathing.  Saw the cardiologist on 10/02/2016 and had echocardiogram done that showed the following:  Impressions:  - LVEF 35-40%, mild LVH, moderately dilated LV, severe global   hypokinesis, grade 1 DD, indeterminate LV filling pressure,   trivial AI, dilated ascending aorta to 4.3 cm, mild MR, normal LA   size,. mild TR, RVSP 27 mmHg, normal IVC.  HPI   Prior to Admission medications   Medication Sig Start Date End Date Taking? Authorizing Provider  amLODipine (NORVASC) 10 MG tablet Take 1 tablet (10 mg total) by mouth daily. Needs office visit for additional refills 10/21/17  Yes Alese Furniss, Eilleen Kempf, MD  atorvastatin (LIPITOR) 20 MG tablet Take 1 tablet (20 mg total) by mouth daily. 10/21/17 10/16/18  Georgina Quint, MD    No Known Allergies  Patient Active Problem List   Diagnosis Date Noted  . Chest pain 10/02/2016  . SOB (shortness of breath) 07/25/2016  . Essential hypertension 07/25/2016  . LVH (left ventricular hypertrophy) 07/25/2016    Past Medical History:  Diagnosis Date  . Hypertension     No past surgical history on file.  Social History   Socioeconomic History  . Marital status: Single    Spouse name: Not on  file  . Number of children: Not on file  . Years of education: Not on file  . Highest education level: Not on file  Occupational History  . Not on file  Social Needs  . Financial resource strain: Not on file  . Food insecurity:    Worry: Not on file    Inability: Not on file  . Transportation needs:    Medical: Not on file    Non-medical: Not on file  Tobacco Use  . Smoking status: Never Smoker  . Smokeless tobacco: Never Used  Substance and Sexual Activity  . Alcohol use: No  . Drug use: No  . Sexual activity: Not on file  Lifestyle  . Physical activity:    Days per week: Not on file    Minutes per session: Not on file  . Stress: Not on file  Relationships  . Social connections:    Talks on phone: Not on file    Gets together: Not on file    Attends religious service: Not on file    Active member of club or organization: Not on file    Attends meetings of clubs or organizations: Not on file    Relationship status: Not on file  . Intimate partner violence:    Fear of current or ex partner: Not on file    Emotionally abused: Not on file    Physically abused: Not on file    Forced sexual  activity: Not on file  Other Topics Concern  . Not on file  Social History Narrative  . Not on file    Family History  Problem Relation Age of Onset  . Diabetes Mother   . Heart disease Mother   . Hypertension Mother   . Diabetes Father   . Hyperlipidemia Father   . Diabetes Sister   . Diabetes Brother   . Diabetes Brother   . Hyperlipidemia Brother   . Hypertension Brother      Review of Systems  Constitutional: Negative.  Negative for chills and fever.  HENT: Negative.  Negative for congestion, hearing loss, nosebleeds and sore throat.   Eyes: Negative.  Negative for blurred vision and double vision.  Respiratory: Negative.  Negative for cough, hemoptysis and shortness of breath.   Cardiovascular: Negative.  Negative for chest pain, palpitations, claudication and leg  swelling.  Gastrointestinal: Negative.  Negative for abdominal pain, diarrhea, nausea and vomiting.  Genitourinary: Negative.  Negative for dysuria and hematuria.  Musculoskeletal: Negative.  Negative for back pain, myalgias and neck pain.  Skin: Negative.  Negative for rash.  Neurological: Negative.  Negative for dizziness, sensory change, speech change, weakness and headaches.  Endo/Heme/Allergies: Negative.   Psychiatric/Behavioral: The patient has insomnia.   All other systems reviewed and are negative.     Vitals:   10/21/17 0937  BP: 106/72  Pulse: 80  Resp: 16  Temp: 98.5 F (36.9 C)  SpO2: 100%    Physical Exam  Constitutional: He is oriented to person, place, and time. He appears well-developed and well-nourished.  HENT:  Head: Normocephalic and atraumatic.  Nose: Nose normal.  Mouth/Throat: Oropharynx is clear and moist.  Eyes: Pupils are equal, round, and reactive to light. Conjunctivae are normal.  Neck: Normal range of motion. Neck supple. No JVD present.  Cardiovascular: Normal rate, regular rhythm and normal heart sounds.  Pulmonary/Chest: Effort normal and breath sounds normal.  Abdominal: Soft. Bowel sounds are normal.  Musculoskeletal: Normal range of motion. He exhibits no edema or tenderness.  Lymphadenopathy:    He has no cervical adenopathy.  Neurological: He is alert and oriented to person, place, and time. No sensory deficit. He exhibits normal muscle tone.  Skin: Skin is warm and dry. Capillary refill takes less than 2 seconds. No rash noted.  Psychiatric: He has a normal mood and affect. His behavior is normal.  Vitals reviewed.  Hypertensive heart disease without heart failure Clinically stable.  No signs of heart failure.  Blood pressure under control.  We will continue present medication.  Follow-up in 6 months.  Dyslipidemia Lipid profile done today.  Continue Lipitor with exercise and diet as recommended.    ASSESSMENT & PLAN: Justin Knox  was seen today for medication refill.  Diagnoses and all orders for this visit:  Essential hypertension -     amLODipine (NORVASC) 10 MG tablet; Take 1 tablet (10 mg total) by mouth daily. Needs office visit for additional refills -     CBC with Differential/Platelet -     Comprehensive metabolic panel  LVH (left ventricular hypertrophy)  Dyslipidemia -     atorvastatin (LIPITOR) 20 MG tablet; Take 1 tablet (20 mg total) by mouth daily. -     Lipid panel  Prediabetes -     Hemoglobin A1c  Hypertensive heart disease without heart failure    Patient Instructions       IF you received an x-ray today, you will receive an invoice from [facility].  Please contact [facility] at [phone] with questions or concerns regarding your invoice.   IF you received labwork today, you will receive an invoice from [facility]. Please contact [facility] at [phone] with questions or concerns regarding your invoice.   Our billing staff will not be able to assist you with questions regarding bills from these companies.  You will be contacted with the lab results as soon as they are available. The fastest way to get your results is to activate your My Chart account. Instructions are located on the last page of this paperwork. If you have not heard from us regarding the results in 2 weeks, please contact this office.     Hypertension Hypertension, commonly called high blood pressure, is when the force of blood pumping through the arteries is too strong. The arteries are the blood vessels that carry blood from the heart throughout the body. Hypertension forces the heart to work harder to pump blood and may cause arteries to become narrow or stiff. Having untreated or uncontrolled hypertension can cause heart attacks, strokes, kidney disease, and other problems. A blood pressure reading consists of a higher number over a lower number. Ideally, your blood pressure should be below  120/80. The first ("top") number is called the systolic pressure. It is a measure of the pressure in your arteries as your heart beats. The second ("bottom") number is called the diastolic pressure. It is a measure of the pressure in your arteries as the heart relaxes. What are the causes? The cause of this condition is not known. What increases the risk? Some risk factors for high blood pressure are under your control. Others are not. Factors you can change  Smoking.  Having type 2 diabetes mellitus, high cholesterol, or both.  Not getting enough exercise or physical activity.  Being overweight.  Having too much fat, sugar, calories, or salt (sodium) in your diet.  Drinking too much alcohol. Factors that are difficult or impossible to change  Having chronic kidney disease.  Having a family history of high blood pressure.  Age. Risk increases with age.  Race. You may be at higher risk if you are African-American.  Gender. Men are at higher risk than women before age 61. After age 61, women are at higher risk than men.  Having obstructive sleep apnea.  Stress. What are the signs or symptoms? Extremely high blood pressure (hypertensive crisis) may cause:  Headache.  Anxiety.  Shortness of breath.  Nosebleed.  Nausea and vomiting.  Severe chest pain.  Jerky movements you cannot control (seizures).  How is this diagnosed? This condition is diagnosed by measuring your blood pressure while you are seated, with your arm resting on a surface. The cuff of the blood pressure monitor will be placed directly against the skin of your upper arm at the level of your heart. It should be measured at least twice using the same arm. Certain conditions can cause a difference in blood pressure between your right and left arms. Certain factors can cause blood pressure readings to be lower or higher than normal (elevated) for a short period of time:  When your blood pressure is higher  when you are in a health care provider's office than when you are at home, this is called white coat hypertension. Most people with this condition do not need medicines.  When your blood pressure is higher at home than when you are in a health care provider's office, this is called masked hypertension. Most people with this  condition may need medicines to control blood pressure.  If you have a high blood pressure reading during one visit or you have normal blood pressure with other risk factors:  You may be asked to return on a different day to have your blood pressure checked again.  You may be asked to monitor your blood pressure at home for 1 week or longer.  If you are diagnosed with hypertension, you may have other blood or imaging tests to help your health care provider understand your overall risk for other conditions. How is this treated? This condition is treated by making healthy lifestyle changes, such as eating healthy foods, exercising more, and reducing your alcohol intake. Your health care provider may prescribe medicine if lifestyle changes are not enough to get your blood pressure under control, and if:  Your systolic blood pressure is above 130.  Your diastolic blood pressure is above 80.  Your personal target blood pressure may vary depending on your medical conditions, your age, and other factors. Follow these instructions at home: Eating and drinking  Eat a diet that is high in fiber and potassium, and low in sodium, added sugar, and fat. An example eating plan is called the DASH (Dietary Approaches to Stop Hypertension) diet. To eat this way: ? Eat plenty of fresh fruits and vegetables. Try to fill half of your plate at each meal with fruits and vegetables. ? Eat whole grains, such as whole wheat pasta, brown rice, or whole grain bread. Fill about one quarter of your plate with whole grains. ? Eat or drink low-fat dairy products, such as skim milk or low-fat  yogurt. ? Avoid fatty cuts of meat, processed or cured meats, and poultry with skin. Fill about one quarter of your plate with lean proteins, such as fish, chicken without skin, beans, eggs, and tofu. ? Avoid premade and processed foods. These tend to be higher in sodium, added sugar, and fat.  Reduce your daily sodium intake. Most people with hypertension should eat less than 1,500 mg of sodium a day.  Limit alcohol intake to no more than 1 drink a day for nonpregnant women and 2 drinks a day for men. One drink equals 12 oz of beer, 5 oz of wine, or 1 oz of hard liquor. Lifestyle  Work with your health care provider to maintain a healthy body weight or to lose weight. Ask what an ideal weight is for you.  Get at least 30 minutes of exercise that causes your heart to beat faster (aerobic exercise) most days of the week. Activities may include walking, swimming, or biking.  Include exercise to strengthen your muscles (resistance exercise), such as pilates or lifting weights, as part of your weekly exercise routine. Try to do these types of exercises for 30 minutes at least 3 days a week.  Do not use any products that contain nicotine or tobacco, such as cigarettes and e-cigarettes. If you need help quitting, ask your health care provider.  Monitor your blood pressure at home as told by your health care provider.  Keep all follow-up visits as told by your health care provider. This is important. Medicines  Take over-the-counter and prescription medicines only as told by your health care provider. Follow directions carefully. Blood pressure medicines must be taken as prescribed.  Do not skip doses of blood pressure medicine. Doing this puts you at risk for problems and can make the medicine less effective.  Ask your health care provider about side effects or reactions  to medicines that you should watch for. Contact a health care provider if:  You think you are having a reaction to a  medicine you are taking.  You have headaches that keep coming back (recurring).  You feel dizzy.  You have swelling in your ankles.  You have trouble with your vision. Get help right away if:  You develop a severe headache or confusion.  You have unusual weakness or numbness.  You feel faint.  You have severe pain in your chest or abdomen.  You vomit repeatedly.  You have trouble breathing. Summary  Hypertension is when the force of blood pumping through your arteries is too strong. If this condition is not controlled, it may put you at risk for serious complications.  Your personal target blood pressure may vary depending on your medical conditions, your age, and other factors. For most people, a normal blood pressure is less than 120/80.  Hypertension is treated with lifestyle changes, medicines, or a combination of both. Lifestyle changes include weight loss, eating a healthy, low-sodium diet, exercising more, and limiting alcohol. This information is not intended to replace advice given to you by your health care provider. Make sure you discuss any questions you have with your health care provider. Document Released: 03/05/2005 Document Revised: 02/01/2016 Document Reviewed: 02/01/2016 Elsevier Interactive Patient Education  2018 [employer]     Edwina Barth, MD [facility]

## 2017-10-21 NOTE — Patient Instructions (Addendum)
   IF you received an x-ray today, you will receive an invoice from [facility]. Please contact  Radiology at [phone] with questions or concerns regarding your invoice.   IF you received labwork today, you will receive an invoice from [facility]. Please contact [facility] at [phone] with questions or concerns regarding your invoice.   Our billing staff will not be able to assist you with questions regarding bills from these companies.  You will be contacted with the lab results as soon as they are available. The fastest way to get your results is to activate your My Chart account. Instructions are located on the last page of this paperwork. If you have not heard from us regarding the results in 2 weeks, please contact this office.     Hypertension Hypertension, commonly called high blood pressure, is when the force of blood pumping through the arteries is too strong. The arteries are the blood vessels that carry blood from the heart throughout the body. Hypertension forces the heart to work harder to pump blood and may cause arteries to become narrow or stiff. Having untreated or uncontrolled hypertension can cause heart attacks, strokes, kidney disease, and other problems. A blood pressure reading consists of a higher number over a lower number. Ideally, your blood pressure should be below 120/80. The first ("top") number is called the systolic pressure. It is a measure of the pressure in your arteries as your heart beats. The second ("bottom") number is called the diastolic pressure. It is a measure of the pressure in your arteries as the heart relaxes. What are the causes? The cause of this condition is not known. What increases the risk? Some risk factors for high blood pressure are under your control. Others are not. Factors you can change  Smoking.  Having type 2 diabetes mellitus, high cholesterol, or both.  Not getting enough exercise or physical  activity.  Being overweight.  Having too much fat, sugar, calories, or salt (sodium) in your diet.  Drinking too much alcohol. Factors that are difficult or impossible to change  Having chronic kidney disease.  Having a family history of high blood pressure.  Age. Risk increases with age.  Race. You may be at higher risk if you are African-American.  Gender. Men are at higher risk than women before age 45. After age 65, women are at higher risk than men.  Having obstructive sleep apnea.  Stress. What are the signs or symptoms? Extremely high blood pressure (hypertensive crisis) may cause:  Headache.  Anxiety.  Shortness of breath.  Nosebleed.  Nausea and vomiting.  Severe chest pain.  Jerky movements you cannot control (seizures).  How is this diagnosed? This condition is diagnosed by measuring your blood pressure while you are seated, with your arm resting on a surface. The cuff of the blood pressure monitor will be placed directly against the skin of your upper arm at the level of your heart. It should be measured at least twice using the same arm. Certain conditions can cause a difference in blood pressure between your right and left arms. Certain factors can cause blood pressure readings to be lower or higher than normal (elevated) for a short period of time:  When your blood pressure is higher when you are in a health care provider's office than when you are at home, this is called white coat hypertension. Most people with this condition do not need medicines.  When your blood pressure is higher at home than when you   are in a health care provider's office, this is called masked hypertension. Most people with this condition may need medicines to control blood pressure.  If you have a high blood pressure reading during one visit or you have normal blood pressure with other risk factors:  You may be asked to return on a different day to have your blood pressure  checked again.  You may be asked to monitor your blood pressure at home for 1 week or longer.  If you are diagnosed with hypertension, you may have other blood or imaging tests to help your health care provider understand your overall risk for other conditions. How is this treated? This condition is treated by making healthy lifestyle changes, such as eating healthy foods, exercising more, and reducing your alcohol intake. Your health care provider may prescribe medicine if lifestyle changes are not enough to get your blood pressure under control, and if:  Your systolic blood pressure is above 130.  Your diastolic blood pressure is above 80.  Your personal target blood pressure may vary depending on your medical conditions, your age, and other factors. Follow these instructions at home: Eating and drinking  Eat a diet that is high in fiber and potassium, and low in sodium, added sugar, and fat. An example eating plan is called the DASH (Dietary Approaches to Stop Hypertension) diet. To eat this way: ? Eat plenty of fresh fruits and vegetables. Try to fill half of your plate at each meal with fruits and vegetables. ? Eat whole grains, such as whole wheat pasta, brown rice, or whole grain bread. Fill about one quarter of your plate with whole grains. ? Eat or drink low-fat dairy products, such as skim milk or low-fat yogurt. ? Avoid fatty cuts of meat, processed or cured meats, and poultry with skin. Fill about one quarter of your plate with lean proteins, such as fish, chicken without skin, beans, eggs, and tofu. ? Avoid premade and processed foods. These tend to be higher in sodium, added sugar, and fat.  Reduce your daily sodium intake. Most people with hypertension should eat less than 1,500 mg of sodium a day.  Limit alcohol intake to no more than 1 drink a day for nonpregnant women and 2 drinks a day for men. One drink equals 12 oz of beer, 5 oz of wine, or 1 oz of hard  liquor. Lifestyle  Work with your health care provider to maintain a healthy body weight or to lose weight. Ask what an ideal weight is for you.  Get at least 30 minutes of exercise that causes your heart to beat faster (aerobic exercise) most days of the week. Activities may include walking, swimming, or biking.  Include exercise to strengthen your muscles (resistance exercise), such as pilates or lifting weights, as part of your weekly exercise routine. Try to do these types of exercises for 30 minutes at least 3 days a week.  Do not use any products that contain nicotine or tobacco, such as cigarettes and e-cigarettes. If you need help quitting, ask your health care provider.  Monitor your blood pressure at home as told by your health care provider.  Keep all follow-up visits as told by your health care provider. This is important. Medicines  Take over-the-counter and prescription medicines only as told by your health care provider. Follow directions carefully. Blood pressure medicines must be taken as prescribed.  Do not skip doses of blood pressure medicine. Doing this puts you at risk for problems and   can make the medicine less effective.  Ask your health care provider about side effects or reactions to medicines that you should watch for. Contact a health care provider if:  You think you are having a reaction to a medicine you are taking.  You have headaches that keep coming back (recurring).  You feel dizzy.  You have swelling in your ankles.  You have trouble with your vision. Get help right away if:  You develop a severe headache or confusion.  You have unusual weakness or numbness.  You feel faint.  You have severe pain in your chest or abdomen.  You vomit repeatedly.  You have trouble breathing. Summary  Hypertension is when the force of blood pumping through your arteries is too strong. If this condition is not controlled, it may put you at risk for serious  complications.  Your personal target blood pressure may vary depending on your medical conditions, your age, and other factors. For most people, a normal blood pressure is less than 120/80.  Hypertension is treated with lifestyle changes, medicines, or a combination of both. Lifestyle changes include weight loss, eating a healthy, low-sodium diet, exercising more, and limiting alcohol. This information is not intended to replace advice given to you by your health care provider. Make sure you discuss any questions you have with your health care provider. Document Released: 03/05/2005 Document Revised: 02/01/2016 Document Reviewed: 02/01/2016 [employer]vier Interactive Patient Education  2018 [employer]  

## 2017-10-21 NOTE — Assessment & Plan Note (Signed)
Clinically stable.  No signs of heart failure.  Blood pressure under control.  We will continue present medication.  Follow-up in 6 months.

## 2017-10-22 ENCOUNTER — Encounter: Payer: Self-pay | Admitting: *Deleted

## 2017-10-22 NOTE — Progress Notes (Signed)
Letter sent.

## 2017-11-12 ENCOUNTER — Telehealth: Payer: Self-pay | Admitting: Emergency Medicine

## 2017-11-12 NOTE — Telephone Encounter (Signed)
Called patient in regards to his appt with Dr. Alvy BimlerSagardia on 04/23/2018. The provider will be out of the office on that day and will need to be rescheduled. Did not leave VM because no dpr on file.

## 2018-01-21 IMAGING — NM NM MISC PROCEDURE
6 series · 36 of 36 positions shown · non-contrast
Comparison: none

[Series 1: rest · 6.51mm/px · 6 of 64 frames shown]
[frame 6/64]
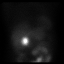
[frame 16/64]
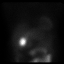
[frame 27/64]
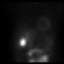
[frame 38/64]
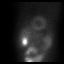
[frame 48/64]
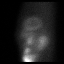
[frame 59/64]
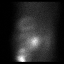

[Series 1: wbr_r-proj_st rest · 6.51mm/px · 6 of 64 frames shown]
[frame 6/64]
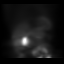
[frame 16/64]
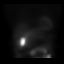
[frame 27/64]
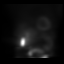
[frame 38/64]
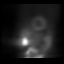
[frame 48/64]
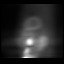
[frame 59/64]
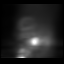

[Series 2: stress · 6.51mm/px · 6 of 496 frames shown (1 of 2)]
[frame 42/496  full-range]
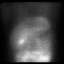
[frame 124/496  full-range]
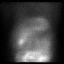
[frame 207/496  full-range]
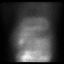
[frame 290/496  full-range]
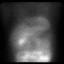
[frame 372/496  full-range]
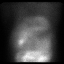
[frame 455/496  full-range]
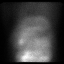

[Series 2: wbr_s-proj_st stress · 6.51mm/px · 6 of 64 frames shown (1 of 2)]
[frame 6/64]
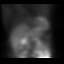
[frame 16/64]
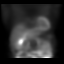
[frame 27/64]
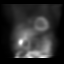
[frame 38/64]
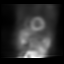
[frame 48/64]
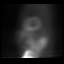
[frame 59/64]
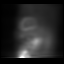

[Series 2: stress · 6.51mm/px · 6 of 64 frames shown (2 of 2)]
[frame 6/64]
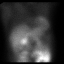
[frame 16/64]
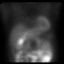
[frame 27/64]
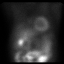
[frame 38/64]
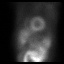
[frame 48/64]
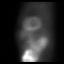
[frame 59/64]
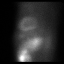

[Series 2: wbr_s-proj_st stress · 6.51mm/px · 6 of 512 frames shown (2 of 2)]
[frame 43/512]
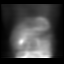
[frame 128/512]
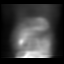
[frame 214/512]
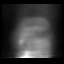
[frame 299/512]
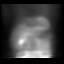
[frame 384/512]
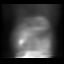
[frame 470/512]
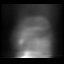

[36 of 36 positions shown; findings below may reference images not displayed]

Canned report from images found in remote index.

Refer to host system for actual result text.

## 2018-04-23 ENCOUNTER — Ambulatory Visit: Payer: Self-pay | Admitting: Emergency Medicine

## 2018-04-25 ENCOUNTER — Other Ambulatory Visit: Payer: Self-pay

## 2018-04-25 ENCOUNTER — Encounter: Payer: Self-pay | Admitting: Emergency Medicine

## 2018-04-25 ENCOUNTER — Ambulatory Visit: Payer: Self-pay | Admitting: Emergency Medicine

## 2018-04-25 VITALS — BP 136/84 | HR 74 | Temp 98.8°F | Resp 20 | Ht 69.49 in | Wt 227.2 lb

## 2018-04-25 DIAGNOSIS — I10 Essential (primary) hypertension: Secondary | ICD-10-CM

## 2018-04-25 DIAGNOSIS — I119 Hypertensive heart disease without heart failure: Secondary | ICD-10-CM

## 2018-04-25 DIAGNOSIS — E785 Hyperlipidemia, unspecified: Secondary | ICD-10-CM

## 2018-04-25 NOTE — Progress Notes (Signed)
Justin Knox A Tauzin 63 y.o.   Chief Complaint  Patient presents with  . Hypertension    6 mth f/u   BP Readings from Last 3 Encounters:  04/25/18 136/84  10/21/17 106/72  10/02/16 122/78   Lab Results  Component Value Date   CHOL 122 10/21/2017   HDL 43 10/21/2017   LDLCALC 53 10/21/2017   TRIG 130 10/21/2017   CHOLHDL 2.8 10/21/2017   Lab Results  Component Value Date   CREATININE 1.02 10/21/2017   BUN 8 10/21/2017   NA 145 (H) 10/21/2017   K 4.3 10/21/2017   CL 104 10/21/2017   CO2 26 10/21/2017    HISTORY OF PRESENT ILLNESS: This is a 63 y.o. male with history of hypertensive heart disease compliant with blood pressure medication.  Here for follow-up.  Doing well.  Has no complaints or medical concerns.  HPI   Prior to Admission medications   Medication Sig Start Date End Date Taking? Authorizing Provider  amLODipine (NORVASC) 10 MG tablet Take 1 tablet (10 mg total) by mouth daily. Needs office visit for additional refills 10/21/17  Yes Keil Pickering, Eilleen KempfMiguel Jose, MD  atorvastatin (LIPITOR) 20 MG tablet Take 1 tablet (20 mg total) by mouth daily. 10/21/17 10/16/18 Yes Lynleigh Kovack, Eilleen KempfMiguel Jose, MD    No Known Allergies  Patient Active Problem List   Diagnosis Date Noted  . Hypertensive heart disease without heart failure 10/21/2017  . Prediabetes 10/21/2017  . Dyslipidemia 10/21/2017  . Chest pain 10/02/2016  . SOB (shortness of breath) 07/25/2016  . Essential hypertension 07/25/2016  . LVH (left ventricular hypertrophy) 07/25/2016    Past Medical History:  Diagnosis Date  . Hypertension     History reviewed. No pertinent surgical history.  Social History   Socioeconomic History  . Marital status: Single    Spouse name: Not on file  . Number of children: 0  . Years of education: Not on file  . Highest education level: Not on file  Occupational History  . Not on file  Social Needs  . Financial resource strain: Not on file  . Food insecurity:   Worry: Not on file    Inability: Not on file  . Transportation needs:    Medical: Not on file    Non-medical: Not on file  Tobacco Use  . Smoking status: Never Smoker  . Smokeless tobacco: Never Used  Substance and Sexual Activity  . Alcohol use: No    Frequency: Never  . Drug use: No  . Sexual activity: Not Currently    Comment: over 10 years  Lifestyle  . Physical activity:    Days per week: Not on file    Minutes per session: Not on file  . Stress: Not on file  Relationships  . Social connections:    Talks on phone: Not on file    Gets together: Not on file    Attends religious service: Not on file    Active member of club or organization: Not on file    Attends meetings of clubs or organizations: Not on file    Relationship status: Not on file  . Intimate partner violence:    Fear of current or ex partner: Not on file    Emotionally abused: Not on file    Physically abused: Not on file    Forced sexual activity: Not on file  Other Topics Concern  . Not on file  Social History Narrative  . Not on file    Family History  Problem Relation Age of Onset  . Diabetes Mother   . Heart disease Mother   . Hypertension Mother   . Diabetes Father   . Hyperlipidemia Father   . Diabetes Sister   . Diabetes Brother   . Diabetes Brother   . Hyperlipidemia Brother   . Hypertension Brother      Review of Systems  Constitutional: Negative.  Negative for chills and fever.  HENT: Negative.   Eyes: Negative.  Negative for blurred vision and double vision.  Respiratory: Negative.  Negative for cough and shortness of breath.   Cardiovascular: Negative.  Negative for chest pain and palpitations.  Gastrointestinal: Negative.  Negative for abdominal pain, diarrhea, nausea and vomiting.  Genitourinary: Negative.   Musculoskeletal: Negative.   Skin: Negative.  Negative for rash.  Neurological: Negative.  Negative for dizziness and headaches.  Endo/Heme/Allergies: Negative.     All other systems reviewed and are negative.   Vitals:   04/25/18 1034  BP: 136/84  Pulse: 74  Resp: 20  Temp: 98.8 F (37.1 C)  SpO2: 97%    Physical Exam Vitals signs reviewed.  Constitutional:      Appearance: Normal appearance.  HENT:     Head: Normocephalic and atraumatic.     Nose: Nose normal.     Mouth/Throat:     Mouth: Mucous membranes are moist.     Pharynx: Oropharynx is clear.  Eyes:     Extraocular Movements: Extraocular movements intact.     Conjunctiva/sclera: Conjunctivae normal.     Pupils: Pupils are equal, round, and reactive to light.  Neck:     Musculoskeletal: Normal range of motion and neck supple.  Cardiovascular:     Rate and Rhythm: Normal rate and regular rhythm.     Heart sounds: Normal heart sounds.  Pulmonary:     Effort: Pulmonary effort is normal.     Breath sounds: Normal breath sounds.  Abdominal:     General: There is no distension.     Palpations: Abdomen is soft.     Tenderness: There is no abdominal tenderness.  Musculoskeletal: Normal range of motion.  Skin:    General: Skin is warm and dry.     Capillary Refill: Capillary refill takes less than 2 seconds.  Neurological:     General: No focal deficit present.     Mental Status: He is alert and oriented to person, place, and time.  Psychiatric:        Mood and Affect: Mood normal.        Behavior: Behavior normal.    A total of 25 minutes was spent in the room with the patient, greater than 50% of which was in counseling/coordination of care regarding hypertension, treatment, exercise and nutrition, medications, prognosis, and need for follow-up.   ASSESSMENT & PLAN: Hypertensive heart disease without heart failure Clinically stable.  Well controlled blood pressure.  No clinical signs of heart failure.  Continue present medications and follow-up in 6 months.  Jaimes was seen today for hypertension.  Diagnoses and all orders for this visit:  Essential  hypertension  Hypertensive heart disease without heart failure  Dyslipidemia    Patient Instructions       If you have lab work done today you will be contacted with your lab results within the next 2 weeks.  If you have not heard from [place] then please contact us. The fastest way to get your results is to register for My Chart.   IF you received  an x-ray today, you will receive an invoice from [facility]. Please contact [facility] at [phone] with questions or concerns regarding your invoice.   IF you received labwork today, you will receive an invoice from [facility]. Please contact [facility] at [phone] with questions or concerns regarding your invoice.   Our billing staff will not be able to assist you with questions regarding bills from these companies.  You will be contacted with the lab results as soon as they are available. The fastest way to get your results is to activate your My Chart account. Instructions are located on the last page of this paperwork. If you have not heard from us regarding the results in 2 weeks, please contact this office.     Hypertension Hypertension, commonly called high blood pressure, is when the force of blood pumping through the arteries is too strong. The arteries are the blood vessels that carry blood from the heart throughout the body. Hypertension forces the heart to work harder to pump blood and may cause arteries to become narrow or stiff. Having untreated or uncontrolled hypertension can cause heart attacks, strokes, kidney disease, and other problems. A blood pressure reading consists of a higher number over a lower number. Ideally, your blood pressure should be below 120/80. The first ("top") number is called the systolic pressure. It is a measure of the pressure in your arteries as your heart beats. The second ("bottom") number is called the diastolic pressure. It is a measure of the pressure in your arteries as the  heart relaxes. What are the causes? The cause of this condition is not known. What increases the risk? Some risk factors for high blood pressure are under your control. Others are not. Factors you can change  Smoking.  Having type 2 diabetes mellitus, high cholesterol, or both.  Not getting enough exercise or physical activity.  Being overweight.  Having too much fat, sugar, calories, or salt (sodium) in your diet.  Drinking too much alcohol. Factors that are difficult or impossible to change  Having chronic kidney disease.  Having a family history of high blood pressure.  Age. Risk increases with age.  Race. You may be at higher risk if you are African-American.  Gender. Men are at higher risk than women before age 10645. After age 62, women are at higher risk than men.  Having obstructive sleep apnea.  Stress. What are the signs or symptoms? Extremely high blood pressure (hypertensive crisis) may cause:  Headache.  Anxiety.  Shortness of breath.  Nosebleed.  Nausea and vomiting.  Severe chest pain.  Jerky movements you cannot control (seizures). How is this diagnosed? This condition is diagnosed by measuring your blood pressure while you are seated, with your arm resting on a surface. The cuff of the blood pressure monitor will be placed directly against the skin of your upper arm at the level of your heart. It should be measured at least twice using the same arm. Certain conditions can cause a difference in blood pressure between your right and left arms. Certain factors can cause blood pressure readings to be lower or higher than normal (elevated) for a short period of time:  When your blood pressure is higher when you are in a health care provider's office than when you are at home, this is called white coat hypertension. Most people with this condition do not need medicines.  When your blood pressure is higher at home than when you are in a health care  provider's  office, this is called masked hypertension. Most people with this condition may need medicines to control blood pressure. If you have a high blood pressure reading during one visit or you have normal blood pressure with other risk factors:  You may be asked to return on a different day to have your blood pressure checked again.  You may be asked to monitor your blood pressure at home for 1 week or longer. If you are diagnosed with hypertension, you may have other blood or imaging tests to help your health care provider understand your overall risk for other conditions. How is this treated? This condition is treated by making healthy lifestyle changes, such as eating healthy foods, exercising more, and reducing your alcohol intake. Your health care provider may prescribe medicine if lifestyle changes are not enough to get your blood pressure under control, and if:  Your systolic blood pressure is above 130.  Your diastolic blood pressure is above 80. Your personal target blood pressure may vary depending on your medical conditions, your age, and other factors. Follow these instructions at home: Eating and drinking   Eat a diet that is high in fiber and potassium, and low in sodium, added sugar, and fat. An example eating plan is called the DASH (Dietary Approaches to Stop Hypertension) diet. To eat this way: ? Eat plenty of fresh fruits and vegetables. Try to fill half of your plate at each meal with fruits and vegetables. ? Eat whole grains, such as whole wheat pasta, brown rice, or whole grain bread. Fill about one quarter of your plate with whole grains. ? Eat or drink low-fat dairy products, such as skim milk or low-fat yogurt. ? Avoid fatty cuts of meat, processed or cured meats, and poultry with skin. Fill about one quarter of your plate with lean proteins, such as fish, chicken without skin, beans, eggs, and tofu. ? Avoid premade and processed foods. These tend to be higher in  sodium, added sugar, and fat.  Reduce your daily sodium intake. Most people with hypertension should eat less than 1,500 mg of sodium a day.  Limit alcohol intake to no more than 1 drink a day for nonpregnant women and 2 drinks a day for men. One drink equals 12 oz of beer, 5 oz of wine, or 1 oz of hard liquor. Lifestyle   Work with your health care provider to maintain a healthy body weight or to lose weight. Ask what an ideal weight is for you.  Get at least 30 minutes of exercise that causes your heart to beat faster (aerobic exercise) most days of the week. Activities may include walking, swimming, or biking.  Include exercise to strengthen your muscles (resistance exercise), such as pilates or lifting weights, as part of your weekly exercise routine. Try to do these types of exercises for 30 minutes at least 3 days a week.  Do not use any products that contain nicotine or tobacco, such as cigarettes and e-cigarettes. If you need help quitting, ask your health care provider.  Monitor your blood pressure at home as told by your health care provider.  Keep all follow-up visits as told by your health care provider. This is important. Medicines  Take over-the-counter and prescription medicines only as told by your health care provider. Follow directions carefully. Blood pressure medicines must be taken as prescribed.  Do not skip doses of blood pressure medicine. Doing this puts you at risk for problems and can make the medicine less effective.  Ask  your health care provider about side effects or reactions to medicines that you should watch for. Contact a health care provider if:  You think you are having a reaction to a medicine you are taking.  You have headaches that keep coming back (recurring).  You feel dizzy.  You have swelling in your ankles.  You have trouble with your vision. Get help right away if:  You develop a severe headache or confusion.  You have unusual  weakness or numbness.  You feel faint.  You have severe pain in your chest or abdomen.  You vomit repeatedly.  You have trouble breathing. Summary  Hypertension is when the force of blood pumping through your arteries is too strong. If this condition is not controlled, it may put you at risk for serious complications.  Your personal target blood pressure may vary depending on your medical conditions, your age, and other factors. For most people, a normal blood pressure is less than 120/80.  Hypertension is treated with lifestyle changes, medicines, or a combination of both. Lifestyle changes include weight loss, eating a healthy, low-sodium diet, exercising more, and limiting alcohol. This information is not intended to replace advice given to you by your health care provider. Make sure you discuss any questions you have with your health care provider. Document Released: 03/05/2005 Document Revised: 02/01/2016 Document Reviewed: 02/01/2016 Elsevier Interactive Patient Education  2019 Elsevier Inc.      Edwina Barth, MD [facility]

## 2018-04-25 NOTE — Assessment & Plan Note (Signed)
Clinically stable.  Well controlled blood pressure.  No clinical signs of heart failure.  Continue present medications and follow-up in 6 months.

## 2018-04-25 NOTE — Patient Instructions (Addendum)
   If you have lab work done today you will be contacted with your lab results within the next 2 weeks.  If you have not heard from us then please contact us. The fastest way to get your results is to register for My Chart.   IF you received an x-ray today, you will receive an invoice from [facility]. Please contact [facility] at [phone] with questions or concerns regarding your invoice.   IF you received labwork today, you will receive an invoice from [facility]. Please contact [facility] at [phone] with questions or concerns regarding your invoice.   Our billing staff will not be able to assist you with questions regarding bills from these companies.  You will be contacted with the lab results as soon as they are available. The fastest way to get your results is to activate your My Chart account. Instructions are located on the last page of this paperwork. If you have not heard from us regarding the results in 2 weeks, please contact this office.       Hypertension Hypertension, commonly called high blood pressure, is when the force of blood pumping through the arteries is too strong. The arteries are the blood vessels that carry blood from the heart throughout the body. Hypertension forces the heart to work harder to pump blood and may cause arteries to become narrow or stiff. Having untreated or uncontrolled hypertension can cause heart attacks, strokes, kidney disease, and other problems. A blood pressure reading consists of a higher number over a lower number. Ideally, your blood pressure should be below 120/80. The first ("top") number is called the systolic pressure. It is a measure of the pressure in your arteries as your heart beats. The second ("bottom") number is called the diastolic pressure. It is a measure of the pressure in your arteries as the heart relaxes. What are the causes? The cause of this condition is not known. What increases the  risk? Some risk factors for high blood pressure are under your control. Others are not. Factors you can change  Smoking.  Having type 2 diabetes mellitus, high cholesterol, or both.  Not getting enough exercise or physical activity.  Being overweight.  Having too much fat, sugar, calories, or salt (sodium) in your diet.  Drinking too much alcohol. Factors that are difficult or impossible to change  Having chronic kidney disease.  Having a family history of high blood pressure.  Age. Risk increases with age.  Race. You may be at higher risk if you are African-American.  Gender. Men are at higher risk than women before age 45. After age 65, women are at higher risk than men.  Having obstructive sleep apnea.  Stress. What are the signs or symptoms? Extremely high blood pressure (hypertensive crisis) may cause:  Headache.  Anxiety.  Shortness of breath.  Nosebleed.  Nausea and vomiting.  Severe chest pain.  Jerky movements you cannot control (seizures). How is this diagnosed? This condition is diagnosed by measuring your blood pressure while you are seated, with your arm resting on a surface. The cuff of the blood pressure monitor will be placed directly against the skin of your upper arm at the level of your heart. It should be measured at least twice using the same arm. Certain conditions can cause a difference in blood pressure between your right and left arms. Certain factors can cause blood pressure readings to be lower or higher than normal (elevated) for a short period of time:    When your blood pressure is higher when you are in a health care provider's office than when you are at home, this is called white coat hypertension. Most people with this condition do not need medicines.  When your blood pressure is higher at home than when you are in a health care provider's office, this is called masked hypertension. Most people with this condition may need medicines  to control blood pressure. If you have a high blood pressure reading during one visit or you have normal blood pressure with other risk factors:  You may be asked to return on a different day to have your blood pressure checked again.  You may be asked to monitor your blood pressure at home for 1 week or longer. If you are diagnosed with hypertension, you may have other blood or imaging tests to help your health care provider understand your overall risk for other conditions. How is this treated? This condition is treated by making healthy lifestyle changes, such as eating healthy foods, exercising more, and reducing your alcohol intake. Your health care provider may prescribe medicine if lifestyle changes are not enough to get your blood pressure under control, and if:  Your systolic blood pressure is above 130.  Your diastolic blood pressure is above 80. Your personal target blood pressure may vary depending on your medical conditions, your age, and other factors. Follow these instructions at home: Eating and drinking   Eat a diet that is high in fiber and potassium, and low in sodium, added sugar, and fat. An example eating plan is called the DASH (Dietary Approaches to Stop Hypertension) diet. To eat this way: ? Eat plenty of fresh fruits and vegetables. Try to fill half of your plate at each meal with fruits and vegetables. ? Eat whole grains, such as whole wheat pasta, brown rice, or whole grain bread. Fill about one quarter of your plate with whole grains. ? Eat or drink low-fat dairy products, such as skim milk or low-fat yogurt. ? Avoid fatty cuts of meat, processed or cured meats, and poultry with skin. Fill about one quarter of your plate with lean proteins, such as fish, chicken without skin, beans, eggs, and tofu. ? Avoid premade and processed foods. These tend to be higher in sodium, added sugar, and fat.  Reduce your daily sodium intake. Most people with hypertension should  eat less than 1,500 mg of sodium a day.  Limit alcohol intake to no more than 1 drink a day for nonpregnant women and 2 drinks a day for men. One drink equals 12 oz of beer, 5 oz of wine, or 1 oz of hard liquor. Lifestyle   Work with your health care provider to maintain a healthy body weight or to lose weight. Ask what an ideal weight is for you.  Get at least 30 minutes of exercise that causes your heart to beat faster (aerobic exercise) most days of the week. Activities may include walking, swimming, or biking.  Include exercise to strengthen your muscles (resistance exercise), such as pilates or lifting weights, as part of your weekly exercise routine. Try to do these types of exercises for 30 minutes at least 3 days a week.  Do not use any products that contain nicotine or tobacco, such as cigarettes and e-cigarettes. If you need help quitting, ask your health care provider.  Monitor your blood pressure at home as told by your health care provider.  Keep all follow-up visits as told by your health care provider.   This is important. Medicines  Take over-the-counter and prescription medicines only as told by your health care provider. Follow directions carefully. Blood pressure medicines must be taken as prescribed.  Do not skip doses of blood pressure medicine. Doing this puts you at risk for problems and can make the medicine less effective.  Ask your health care provider about side effects or reactions to medicines that you should watch for. Contact a health care provider if:  You think you are having a reaction to a medicine you are taking.  You have headaches that keep coming back (recurring).  You feel dizzy.  You have swelling in your ankles.  You have trouble with your vision. Get help right away if:  You develop a severe headache or confusion.  You have unusual weakness or numbness.  You feel faint.  You have severe pain in your chest or abdomen.  You vomit  repeatedly.  You have trouble breathing. Summary  Hypertension is when the force of blood pumping through your arteries is too strong. If this condition is not controlled, it may put you at risk for serious complications.  Your personal target blood pressure may vary depending on your medical conditions, your age, and other factors. For most people, a normal blood pressure is less than 120/80.  Hypertension is treated with lifestyle changes, medicines, or a combination of both. Lifestyle changes include weight loss, eating a healthy, low-sodium diet, exercising more, and limiting alcohol. This information is not intended to replace advice given to you by your health care provider. Make sure you discuss any questions you have with your health care provider. Document Released: 03/05/2005 Document Revised: 02/01/2016 Document Reviewed: 02/01/2016 Elsevier Interactive Patient Education  2019 [employer]vier Inc.  

## 2018-10-02 ENCOUNTER — Other Ambulatory Visit: Payer: Self-pay | Admitting: Emergency Medicine

## 2018-10-02 DIAGNOSIS — E785 Hyperlipidemia, unspecified: Secondary | ICD-10-CM

## 2018-10-02 NOTE — Telephone Encounter (Signed)
Requested Prescriptions  Pending Prescriptions Disp Refills  . atorvastatin (LIPITOR) 20 MG tablet [Pharmacy Med Name: ATORVASTATIN CALCIUM 20 MG TAB] 90 tablet 3    Sig: TAKE ONE TABLET DAILY     Cardiovascular:  Antilipid - Statins Passed - 10/02/2018  9:05 AM      Passed - Total Cholesterol in normal range and within 360 days    Cholesterol, Total  Date Value Ref Range Status  10/21/2017 122 100 - 199 mg/dL Final         Passed - LDL in normal range and within 360 days    LDL Calculated  Date Value Ref Range Status  10/21/2017 53 0 - 99 mg/dL Final         Passed - HDL in normal range and within 360 days    HDL  Date Value Ref Range Status  10/21/2017 43 >39 mg/dL Final         Passed - Triglycerides in normal range and within 360 days    Triglycerides  Date Value Ref Range Status  10/21/2017 130 0 - 149 mg/dL Final         Passed - Patient is not pregnant      Passed - Valid encounter within last 12 months    Recent Outpatient Visits          5 months ago Essential hypertension   Primary Care at [place], [place], MD   11 months ago Essential hypertension   Primary Care at [place], [place], MD   2 years ago SOB (shortness of breath)   Primary Care at [facility], Ines Bloomer, MD

## 2018-10-21 ENCOUNTER — Other Ambulatory Visit: Payer: Self-pay | Admitting: Emergency Medicine

## 2018-10-21 DIAGNOSIS — I10 Essential (primary) hypertension: Secondary | ICD-10-CM

## 2018-10-24 ENCOUNTER — Ambulatory Visit: Payer: Self-pay | Admitting: Emergency Medicine

## 2018-12-04 ENCOUNTER — Other Ambulatory Visit: Payer: Self-pay | Admitting: Emergency Medicine

## 2018-12-04 DIAGNOSIS — I10 Essential (primary) hypertension: Secondary | ICD-10-CM

## 2018-12-04 NOTE — Telephone Encounter (Signed)
Requested medication (s) are due for refill today: yes  Requested medication (s) are on the active medication list: yes  Last refill:  10/21/2018  Future visit scheduled: no  Notes to clinic:  Review for refill Overdue for office visit   Requested Prescriptions  Pending Prescriptions Disp Refills   amLODipine (NORVASC) 10 MG tablet [Pharmacy Med Name: AMLODIPINE BESYLATE 10 MG TAB] 30 tablet 0    Sig: TAKE ONE TABLET DAILY     Cardiovascular:  Calcium Channel Blockers Failed - 12/04/2018 12:49 PM      Failed - Valid encounter within last 6 months    Recent Outpatient Visits          7 months ago Essential hypertension   [facility] at [place], Ines [place], MD   1 year ago Essential hypertension   [facility] at [place], [place], MD   2 years ago SOB (shortness of breath)   Primary Care at [facility], [place], [place]             Passed - Last BP in normal range    BP Readings from Last 1 Encounters:  04/25/18 136/84

## 2018-12-24 ENCOUNTER — Telehealth: Payer: Self-pay

## 2018-12-24 ENCOUNTER — Other Ambulatory Visit: Payer: Self-pay

## 2018-12-24 ENCOUNTER — Telehealth (INDEPENDENT_AMBULATORY_CARE_PROVIDER_SITE_OTHER): Payer: Self-pay | Admitting: Cardiology

## 2018-12-24 ENCOUNTER — Encounter: Payer: Self-pay | Admitting: Cardiology

## 2018-12-24 ENCOUNTER — Encounter: Payer: Self-pay | Admitting: *Deleted

## 2018-12-24 VITALS — BP 109/74 | HR 76 | Ht 69.5 in

## 2018-12-24 DIAGNOSIS — I77810 Thoracic aortic ectasia: Secondary | ICD-10-CM

## 2018-12-24 DIAGNOSIS — E7800 Pure hypercholesterolemia, unspecified: Secondary | ICD-10-CM

## 2018-12-24 DIAGNOSIS — I420 Dilated cardiomyopathy: Secondary | ICD-10-CM

## 2018-12-24 DIAGNOSIS — I5042 Chronic combined systolic (congestive) and diastolic (congestive) heart failure: Secondary | ICD-10-CM

## 2018-12-24 DIAGNOSIS — I10 Essential (primary) hypertension: Secondary | ICD-10-CM

## 2018-12-24 NOTE — Progress Notes (Signed)
Virtual Visit via Telehpone Note   This visit type was conducted due to national recommendations for restrictions regarding the COVID-19 Pandemic (e.g. social distancing) in an effort to limit this patient's exposure and mitigate transmission in our community.  Due to his co-morbid illnesses, this patient is at least at moderate risk for complications without adequate follow up.  This format is felt to be most appropriate for this patient at this time.  All issues noted in this document were discussed and addressed.  A limited physical exam was performed with this format.  Please refer to the patient's chart for his consent to telehealth [facility].   Evaluation Performed:  Follow-up visit  This visit type was conducted due to national recommendations for restrictions regarding the COVID-19 Pandemic (e.g. social distancing).  This format is felt to be most appropriate for this patient at this time.  All issues noted in this document were discussed and addressed.  No physical exam was performed (except for noted visual exam findings with Video Visits).  Please refer to the patient's chart (MyChart message for video visits and phone note for telephone visits) for the patient's consent to telehealth [facility].  Date:  12/24/2018   ID:  Justin Knox, DOB October 01, 1955, MRN [id number]  Patient Location:  Home  [place]   [place]  PCP:  Georgina QuintSagardia, Miguel Jose, MD  Cardiologist:  Armanda Magicraci Divya Munshi, MD Electrophysiologist:  None   Chief Complaint:  HTN, SOB  History of Present Illness:    Justin Knox is a 63 y.o. male who presents via audio/video conferencing for a telehealth visit today.    This is a 63yo male with a history of HTN, SOB and midsternal CP seen initially in 2018 at which time nuclear stress test showed EF 30% with mild apical ischemia but coronary calcium score was 0.  2D echo confirmed moderate LV dysfunction with EF 35-40% with diffuse HK  and grade 1 DD.  There was also mild to moderate dilatation of the ascending aorta. Cardiac cath was recommended at that time but patient refused and also refused coronary CTA.  He was started on Lipitor and instructed to followed up but never followed up.    He is here today for followup and is doing well.  He denies any chest pain or pressure, SOB, DOE, PND, orthopnea, LE edema, dizziness, palpitations or syncope. He is compliant with his meds and is tolerating meds with no SE.  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  He cannot leave the house and refuses to wear a mask so he cannot bring him with him to appt.  The patient does not have symptoms concerning for COVID-19 infection (fever, chills, cough, or new shortness of breath).   Prior CV studies:   The following studies were reviewed today:  Coronary Ca score, 2D echo and stress test  Past Medical History:  Diagnosis Date  . Hypertension    No past surgical history on file.   Current Meds  Medication Sig  . amLODipine (NORVASC) 10 MG tablet TAKE ONE TABLET DAILY  . atorvastatin (LIPITOR) 20 MG tablet TAKE ONE TABLET DAILY     Allergies:   Patient has no known allergies.   Social History   Tobacco Use  . Smoking status: Never Smoker  .  Smokeless tobacco: Never Used  Substance Use Topics  . Alcohol use: No    Frequency: Never  . Drug use: No     Family Hx: The patient's family history includes Diabetes in his brother, brother, father, mother, and sister; Heart disease in his mother; Hyperlipidemia in his brother and father; Hypertension in his brother and mother.  ROS:   Please see the history of present illness.     All other systems reviewed and are negative.   Labs/Other Tests and Data Reviewed:    Recent Labs: No results found for requested labs within  last 8760 hours.   Recent Lipid Panel Lab Results  Component Value Date/Time   CHOL 122 10/21/2017 12:27 PM   TRIG 130 10/21/2017 12:27 PM   HDL 43 10/21/2017 12:27 PM   CHOLHDL 2.8 10/21/2017 12:27 PM   LDLCALC 53 10/21/2017 12:27 PM    Wt Readings from Last 3 Encounters:  04/25/18 227 lb 3.2 oz (103.1 kg)  10/21/17 218 lb 9.6 oz (99.2 kg)  10/08/16 218 lb (98.9 kg)     Objective:    Vital Signs:  BP 109/74   Pulse 76   Ht 5' 9.5" (1.765 m)   BMI 33.07 kg/m    ASSESSMENT & PLAN:    1.  Chronic combined systolic/diastolic CHF -weight has been stable and he denies any SOB or LE edema -he has refused workup in the past as he is single and taking care of a severely autistic child and cannot leave him alone and has no one else to take care of him at all -unfortunately I cannot get him on guideline directed HF meds due to the fact that he cannot come in for blood work after starting diuretics and ARB.  I told him my concerns in not coming in for tests and if his heart dysfunction progressed he could have problems that may not be able to be fixed if left unattended for too long  2.  DCM -EF 35-40% on echo with diffuse HK -coronary Ca score was 0 and nuclear stress test in 2018 showed ? Mild apical ischemia -he refused cath and coronary CTA because he has a foster child who cannot be left alone ever -he has not had any chest pain  3.  HLD -LDL was atorvastatin 20mg  daily  4.  HTN -BP controlled on exam -continue amlodipine 10mg  daily  5.  Dilated ascending aorta -59mm by echo 2018 -he refuses to come in for repeat echo or Chest CT because he cannot leave his foster child -he will see if he can workout some type of special care for the child  COVID-19 Education: The signs and symptoms of COVID-19 were discussed with the patient and how to seek care for testing (follow up with PCP or arrange E-visit).  The importance of social distancing was discussed today.  Patient  Risk:   After full review of this patient's clinical status, I feel that they are at least moderate risk at this time.  Time:   Today, I have spent 20 minutes directly with the patient on telemedicine discussing medical problems including DCM, CHF, HTN.  We also reviewed the symptoms of COVID 19 and the ways to protect against contracting the virus with telehealth technology.  I spent an additional 5 minutes reviewing patient's chart including 2D echo.  Medication Adjustments/Labs and Tests Ordered: Current medicines are reviewed at length with the patient today.  Concerns regarding medicines are outlined above.  Tests Ordered: No  orders of the defined types were placed in this encounter.  Medication Changes: No orders of the defined types were placed in this encounter.   Disposition:  Follow up in 1 year(s)  Signed, Fransico Him, MD  12/24/2018 1:47 PM    [facility]

## 2018-12-24 NOTE — Telephone Encounter (Signed)
Virtual Visit Pre-Appointment Phone Call  "(Name), I am calling you today to discuss your upcoming appointment. We are currently trying to limit exposure to the virus that causes COVID-19 by seeing patients at home rather than in the office."  1. "What is the BEST phone number to call the day of the visit?" - include this in appointment notes  2. "Do you have or have access to (through a family member/friend) a smartphone with video capability that we can use for your visit?" a. If yes - list this number in appt notes as "cell" (if different from BEST phone #) and list the appointment type as a VIDEO visit in appointment notes b. If no - list the appointment type as a PHONE visit in appointment notes  3. Confirm consent - "In the setting of the current Covid19 crisis, you are scheduled for a (phone or video) visit with your provider on (date) at (time).  Just as we do with many in-office visits, in order for you to participate in this visit, we must obtain consent.  If you'd like, I can send this to your mychart (if signed up) or email for you to review.  Otherwise, I can obtain your verbal consent now.  All virtual visits are billed to your insurance company just like a normal visit would be.  By agreeing to a virtual visit, we'd like you to understand that the technology does not allow for your provider to perform an examination, and thus may limit your provider's ability to fully assess your condition. If your provider identifies any concerns that need to be evaluated in person, we will make arrangements to do so.  Finally, though the technology is pretty good, we cannot assure that it will always work on either your or our end, and in the setting of a video visit, we may have to convert it to a phone-only visit.  In either situation, we cannot ensure that we have a secure connection.  Are you willing to proceed?" STAFF: Did the patient verbally acknowledge consent to telehealth visit? Document  YES/NO here: YES  4. Advise patient to be prepared - "Two hours prior to your appointment, go ahead and check your blood pressure, pulse, oxygen saturation, and your weight (if you have the equipment to check those) and write them all down. When your visit starts, your provider will ask you for this information. If you have an Apple Watch or Kardia device, please plan to have heart rate information ready on the day of your appointment. Please have a pen and paper handy nearby the day of the visit as well."  5. Give patient instructions for MyChart download to smartphone OR Doximity/[email or web address] as below if video visit (depending on what platform provider is using)  6. Inform patient they will receive a phone call 15 minutes prior to their appointment time (may be from unknown caller ID) so they should be prepared to answer    TELEPHONE CALL NOTE  Justin GADSON has been deemed a candidate for a follow-up tele-health visit to limit community exposure during the Covid-19 pandemic. I spoke with the patient via phone to ensure availability of phone/video source, confirm preferred email & phone number, and discuss instructions and expectations.  I reminded BROCKTON MCKESSON to be prepared with any vital sign and/or heart rhythm information that could potentially be obtained via home monitoring, at the time of his visit. I reminded DAVANTA Knox to expect a phone call prior to  his visit.  Khadija Thier, CMA 12/24/2018 1:23 PM   INSTRUCTIONS FOR DOWNLOADING THE MYCHART APP TO SMARTPHONE  - The patient must first make sure to have activated MyChart and know their login information - If Apple, go to [employer] and type in MyChart in the search bar and download the app. If Android, ask patient to go to [employer] and type in [place] in the search bar and download the app. The app is free but as with any other app downloads, their phone may require them to verify saved payment information or  Apple/Android password.  - The patient will need to then log into the app with their MyChart username and password, and select [facility] as their healthcare provider to link the account. When it is time for your visit, go to the MyChart app, find appointments, and click Begin Video Visit. Be sure to Select Allow for your device to access the Microphone and Camera for your visit. You will then be connected, and your provider will be with you shortly.  **If they have any issues connecting, or need assistance please contact MyChart service desk [phone])**  **If using a computer, in order to ensure the best quality for their visit they will need to use either of the following Internet Browsers: [employer] or Google Chrome**  IF USING DOXIMITY or DOXY.ME - The patient will receive a link just prior to their visit by text.     FULL LENGTH CONSENT FOR TELE-HEALTH VISIT   I hereby voluntarily request, consent and authorize [facility] and its employed or contracted physicians, physician assistants, nurse practitioners or other licensed health care professionals (the Practitioner), to provide me with telemedicine health care services (the "Services") as deemed necessary by the treating Practitioner. I acknowledge and consent to receive the Services by the Practitioner via telemedicine. I understand that the telemedicine visit will involve communicating with the Practitioner through live audiovisual communication technology and the disclosure of certain medical information by electronic transmission. I acknowledge that I have been given the opportunity to request an in-person assessment or other available alternative prior to the telemedicine visit and am voluntarily participating in the telemedicine visit.  I understand that I have the right to withhold or withdraw my consent to the use of telemedicine in the course of my care at any time, without affecting my right to future care  or treatment, and that the Practitioner or I may terminate the telemedicine visit at any time. I understand that I have the right to inspect all information obtained and/or recorded in the course of the telemedicine visit and may receive copies of available information for a reasonable fee.  I understand that some of the potential risks of receiving the Services via telemedicine include:  Marland Kitchen Delay or interruption in medical evaluation due to technological equipment failure or disruption; . Information transmitted may not be sufficient (e.g. poor resolution of images) to allow for appropriate medical decision making by the Practitioner; and/or  . In rare instances, security protocols could fail, causing a breach of personal health information.  Furthermore, I acknowledge that it is my responsibility to provide information about my medical history, conditions and care that is complete and accurate to the best of my ability. I acknowledge that Practitioner's advice, recommendations, and/or decision may be based on factors not within their control, such as incomplete or inaccurate data provided by me or distortions of diagnostic images or specimens that may result from electronic transmissions. I  understand that the practice of medicine is not an exact science and that Practitioner makes no warranties or guarantees regarding treatment outcomes. I acknowledge that I will receive a copy of this consent concurrently upon execution via email to the email address I last provided but may also request a printed copy by calling the office of [place].    I understand that my insurance will be billed for this visit.   I have read or had this consent read to me. . I understand the contents of this consent, which adequately explains the benefits and risks of the Services being provided via telemedicine.  . I have been provided ample opportunity to ask questions regarding this consent and the Services and have had  my questions answered to my satisfaction. . I give my informed consent for the services to be provided through the use of telemedicine in my medical care  By participating in this telemedicine visit I agree to the above.

## 2018-12-24 NOTE — Patient Instructions (Signed)
Medication Instructions:   Your physician recommends that you continue on your current medications as directed. Please refer to the Current Medication list given to you today.  If you need a refill on your cardiac medications before your next appointment, please call your pharmacy.     Follow-Up: At [facility], you and your health needs are our priority.  As part of our continuing mission to provide you with exceptional heart care, we have created designated Provider Care Teams.  These Care Teams include your primary Cardiologist (physician) and Advanced Practice Providers (APPs -  Physician Assistants and Nurse Practitioners) who all work together to provide you with the care you need, when you need it. You will need a follow up appointment in 12 months with Dr. Radford Pax.  Please call our office 2 months in advance to schedule this appointment.  You may see Dr. Radford Pax or one of the following Advanced Practice Providers on your designated Care Team:   [place], PA-C Melina Copa, PA-C . Ermalinda Barrios, PA-C

## 2019-01-07 ENCOUNTER — Other Ambulatory Visit: Payer: Self-pay | Admitting: Emergency Medicine

## 2019-01-07 DIAGNOSIS — I10 Essential (primary) hypertension: Secondary | ICD-10-CM

## 2019-01-07 NOTE — Telephone Encounter (Signed)
Requested medication (s) are due for refill today: yes  Requested medication (s) are on the active medication list: yes  Last refill:  12/05/2018  Future visit scheduled: no  Notes to clinic: overdue for office visit    Requested Prescriptions  Pending Prescriptions Disp Refills   amLODipine (NORVASC) 10 MG tablet [Pharmacy Med Name: AMLODIPINE BESYLATE 10 MG TAB] 30 tablet 0    Sig: TAKE ONE TABLET DAILY     Cardiovascular:  Calcium Channel Blockers Failed - 01/07/2019  2:25 PM      Failed - Valid encounter within last 6 months    Recent Outpatient Visits          8 months ago Essential hypertension   Primary Care at [place], Ines Bloomer, MD   1 year ago Essential hypertension   Primary Care at [place], [place], MD   2 years ago SOB (shortness of breath)   Primary Care at [facility], [place], MD             [place] BP in normal range    BP Readings from Last 1 Encounters:  12/24/18 109/74

## 2019-02-09 ENCOUNTER — Other Ambulatory Visit: Payer: Self-pay | Admitting: Emergency Medicine

## 2019-02-09 DIAGNOSIS — I10 Essential (primary) hypertension: Secondary | ICD-10-CM

## 2019-02-09 NOTE — Telephone Encounter (Signed)
Requested medication (s) are due for refill today: yes  Requested medication (s) are on the active medication list: yes  Last refill: 01/09/2019  Future visit scheduled: no  Notes to clinic: review for refill Overdue for office visit    Requested Prescriptions  Pending Prescriptions Disp Refills   amLODipine (NORVASC) 10 MG tablet [Pharmacy Med Name: AMLODIPINE BESYLATE 10 MG TAB] 30 tablet 0    Sig: TAKE ONE TABLET EACH DAY     Cardiovascular:  Calcium Channel Blockers Failed - 02/09/2019 12:18 PM      Failed - Valid encounter within last 6 months    Recent Outpatient Visits          9 months ago Essential hypertension   Primary Care at [place], Ines Bloomer, MD   1 year ago Essential hypertension   Primary Care at [place], [place], MD   2 years ago SOB (shortness of breath)   Primary Care at [facility], [place], [place]             [place] BP in normal range    BP Readings from Last 1 Encounters:  12/24/18 109/74

## 2020-03-19 DEATH — deceased
